# Patient Record
Sex: Male | Born: 1955
Health system: Southern US, Community
[De-identification: ages and names within clinical notes are randomized; demographics above are authoritative.]

## PROBLEM LIST (undated history)

## (undated) DIAGNOSIS — E119 Type 2 diabetes mellitus without complications: Secondary | ICD-10-CM

## (undated) DIAGNOSIS — I10 Essential (primary) hypertension: Secondary | ICD-10-CM

## (undated) DIAGNOSIS — E785 Hyperlipidemia, unspecified: Secondary | ICD-10-CM

## (undated) HISTORY — DX: Type 2 diabetes mellitus without complications: E11.9

## (undated) HISTORY — DX: Hyperlipidemia, unspecified: E78.5

## (undated) HISTORY — DX: Essential (primary) hypertension: I10

---

## 1997-11-02 ENCOUNTER — Emergency Department (HOSPITAL_COMMUNITY): Admission: EM | Admit: 1997-11-02 | Discharge: 1997-11-02 | Payer: Self-pay | Admitting: Emergency Medicine

## 2001-01-06 ENCOUNTER — Ambulatory Visit (HOSPITAL_COMMUNITY): Admission: RE | Admit: 2001-01-06 | Discharge: 2001-01-06 | Payer: Self-pay | Admitting: Gastroenterology

## 2002-11-18 ENCOUNTER — Emergency Department (HOSPITAL_COMMUNITY): Admission: AD | Admit: 2002-11-18 | Discharge: 2002-11-18 | Payer: Self-pay | Admitting: Family Medicine

## 2004-11-12 ENCOUNTER — Encounter: Admission: RE | Admit: 2004-11-12 | Discharge: 2004-11-12 | Payer: Self-pay | Admitting: Cardiology

## 2008-01-05 ENCOUNTER — Encounter: Admission: RE | Admit: 2008-01-05 | Discharge: 2008-01-05 | Payer: Self-pay | Admitting: Cardiology

## 2009-04-18 IMAGING — CR DG CHEST 2V
2 series · 2 of 2 positions shown · non-contrast
Comparison: 11/12/2004

CLINICAL DATA: Cough, sinusitis

CHEST - 2 VIEW

[w chest pa]
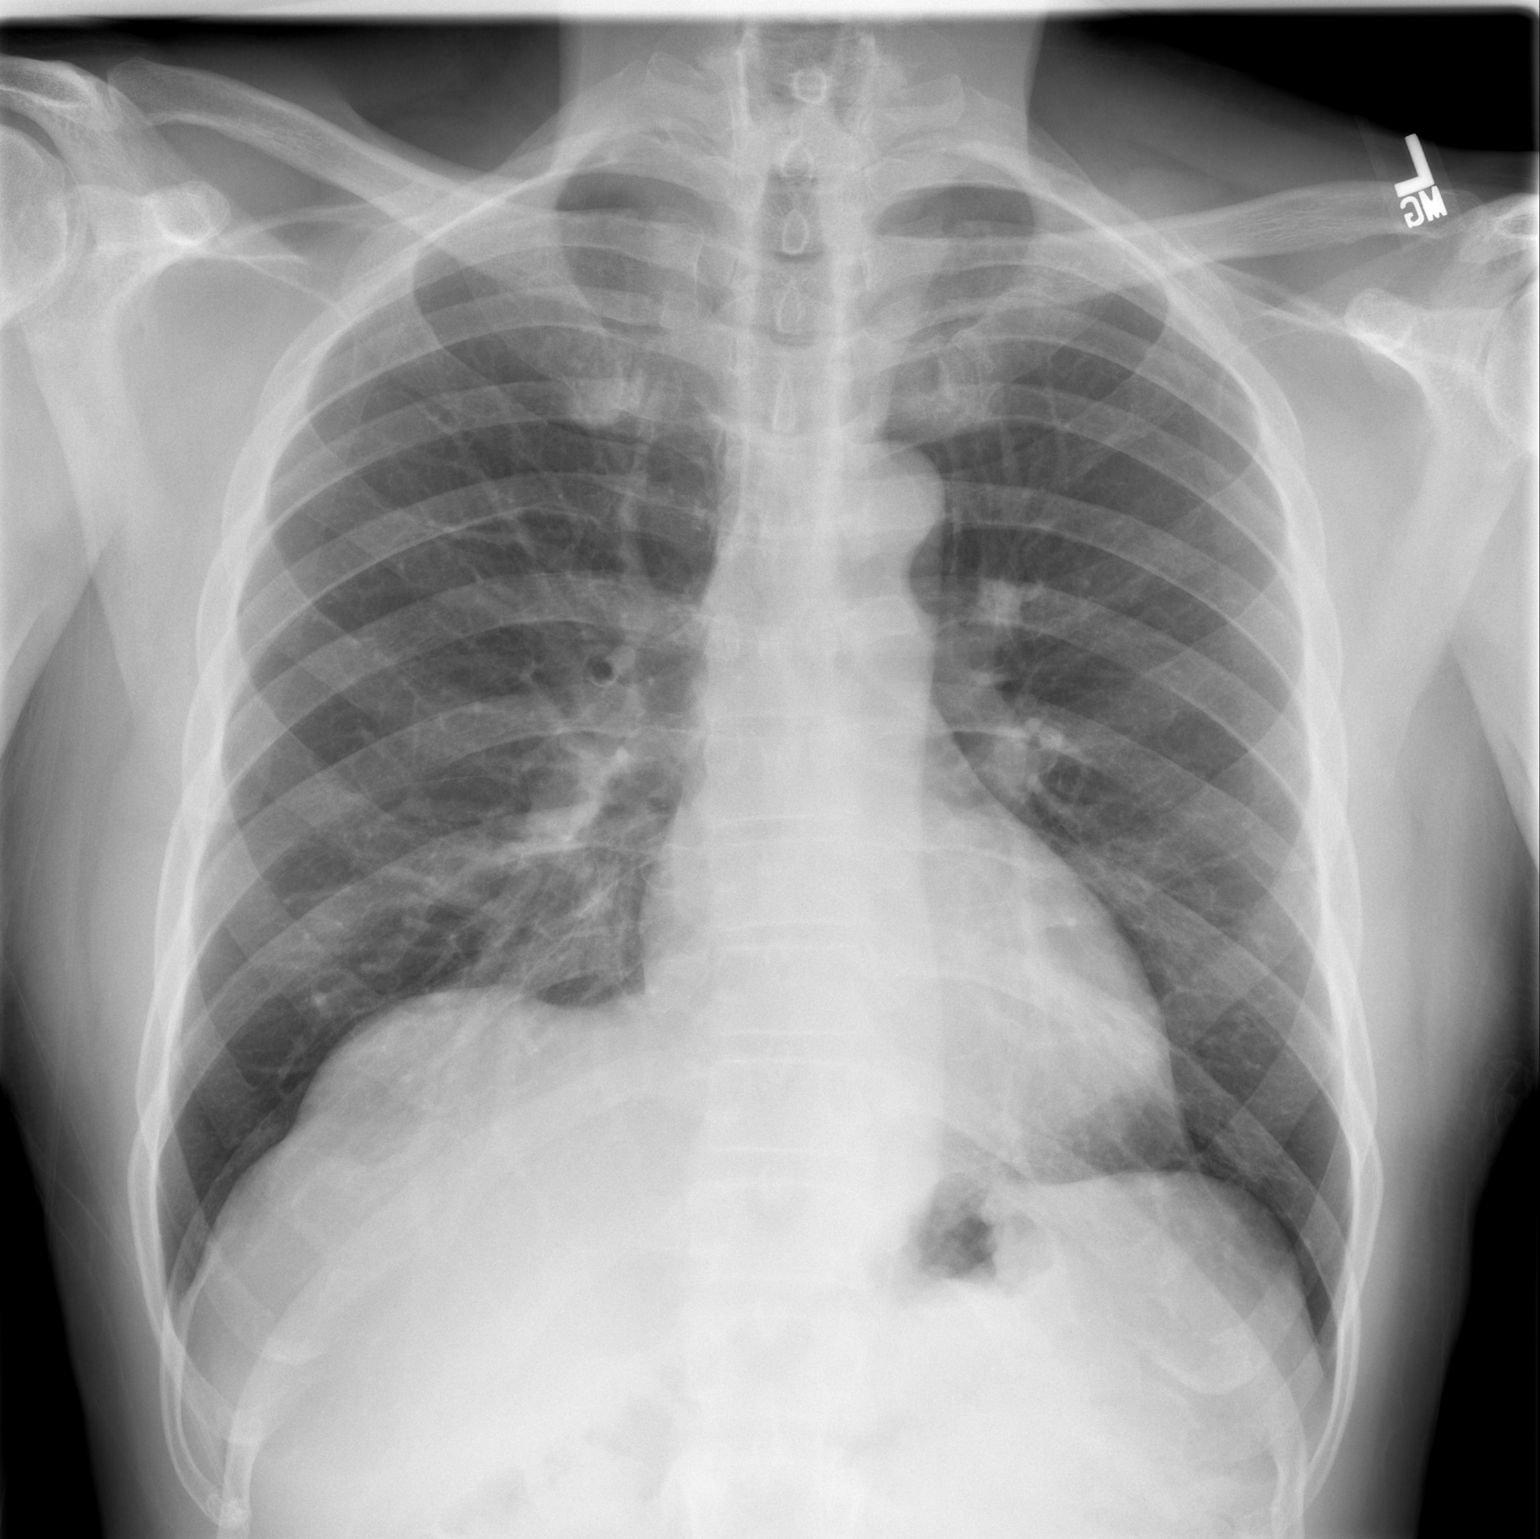

[w chest lat]
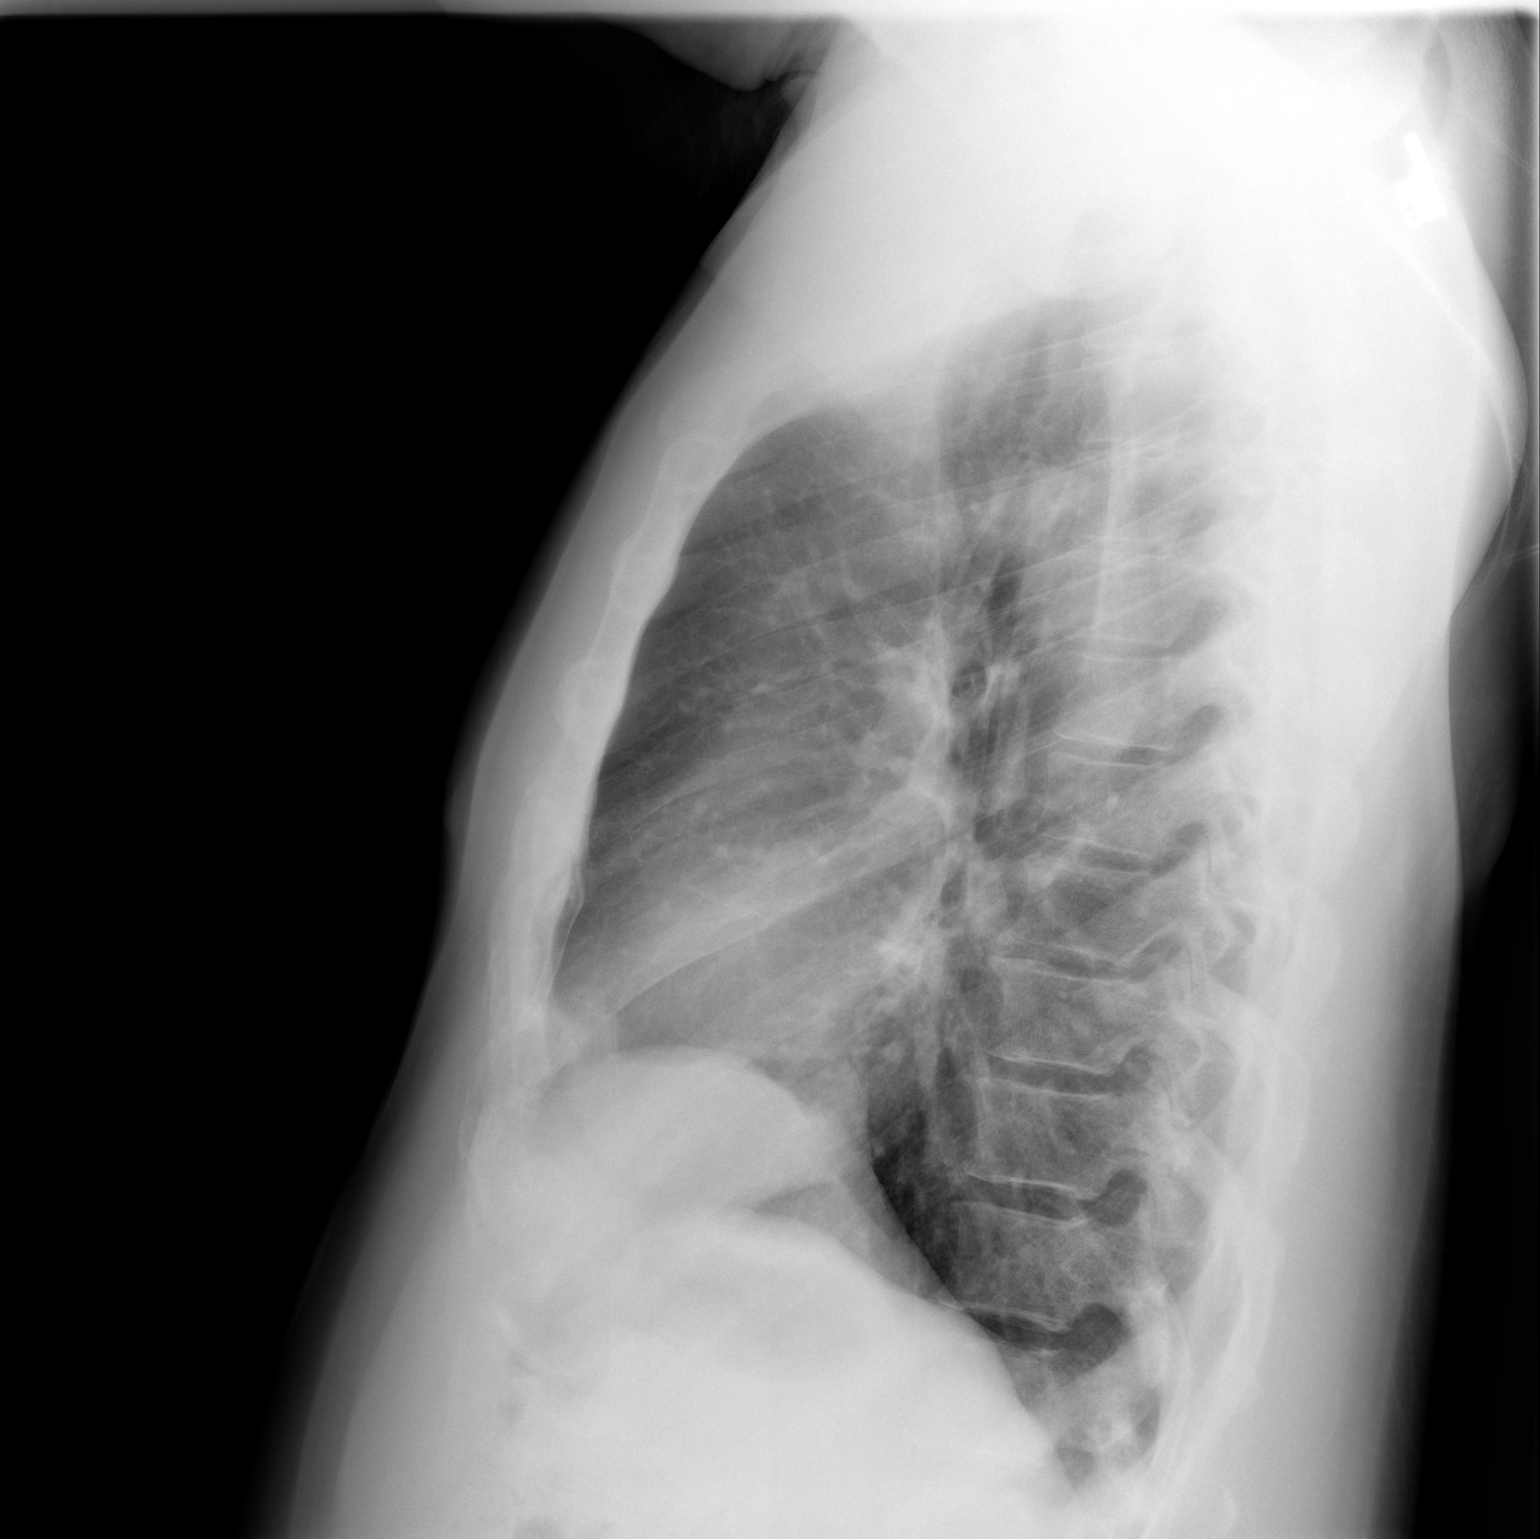

[2 of 2 positions shown; findings below may reference images not displayed]

FINDINGS: Lungs clear.  Heart size and pulmonary vascularity
normal.  No effusion.  Visualized bones unremarkable.
IMPRESSION: No acute disease

## 2010-06-14 NOTE — Procedures (Signed)
[facility]  Patient:    Fernando Ingram, Fernando Ingram Visit Number: [id number] MRN: [id number]          Service Type: Attending:  Verlin Grills, M.D. Dictated by:   Verlin Grills, M.D. Proc. Date: 01/06/01   CC:         Dr. Estill Cotta   Procedure Report  PROCEDURE:  Esophagogastroduodenoscopy.  REFERRING PHYSICIAN:  Dr. Lorin Picket _________.  INDICATIONS FOR PROCEDURE:  The patient (date of birth, 08-Dec-1955) is a 55 year old male with heartburn and dysphagia.  I discussed with the patient the complications associated with esophagogastroduodenoscopy and esophageal dilation including intestinal bleeding and intestinal perforation.  The patient has signed the operative permit.  ENDOSCOPIST:  Verlin Grills, M.D.  PREMEDICATION:  Versed 10 mg and Demerol 100 mg.  ENDOSCOPE:  Olympus gastroscope.  DESCRIPTION OF PROCEDURE:  After obtaining informed consent, the patient was placed in the left lateral decubitus position.  I administered intravenous Demerol and intravenous Versed to achieve conscious sedation for the procedure.  The patients blood pressure, oxygen saturation, and cardiac rhythm were monitored throughout the procedure and documented in the medical record.  The Olympus gastroscope was passed through the posterior hypopharynx and the proximal esophagus without difficulty.  The hypopharynx, larynx, and vocal cords appeared normal.  Esophagoscopy:  The proximal, mid, and lower segments of the esophagus appear normal.  The squamocolumnar junction and the esophagogastric junction noted at 40 cm from the incisor teeth.  Endoscopically, there is no evidence of the presence of Barretts esophagus, erosive esophagitis, esophageal mucosal scarring, esophageal stricture formation, esophageal tumors, or esophageal ulcers.  Gastroscopy:  I was easily able to traverse the lower esophageal sphincter and entered the stomach.   Retroflexed view of the gastric cardia and fundus was normal.  The gastric body, antrum, and pylorus appeared normal.  Duodenoscopy:  The duodenal bulb, mid duodenum, and distal duodenum appeared normal.  ASSESSMENT:  Normal esophagogastroduodenoscopy.  RECOMMENDATIONS:  The patient has nonerosive esophageal reflux disease.  His dysphagia is probably due to esophageal motility disorder and associated with his acid reflux.  I doubt he has achalasia.  I would treat his heartburn with a proton pump inhibitor and use p.r.n. sublingual hyoscyamine or episodes of dysphagia. Dictated by:   Verlin Grills, M.D. Attending:  Verlin Grills, M.D. DD:  01/06/01 TD:  01/06/01 Job: [id number]

## 2011-02-11 ENCOUNTER — Ambulatory Visit: Payer: Self-pay

## 2011-03-25 ENCOUNTER — Ambulatory Visit
Admission: RE | Admit: 2011-03-25 | Discharge: 2011-03-25 | Disposition: A | Discharge status: Home / Self Care | Payer: 59 | Source: Ambulatory Visit | Attending: Cardiology | Admitting: Cardiology

## 2011-03-25 ENCOUNTER — Other Ambulatory Visit: Payer: Self-pay | Admitting: Cardiology

## 2011-03-25 DIAGNOSIS — E785 Hyperlipidemia, unspecified: Secondary | ICD-10-CM

## 2011-03-25 DIAGNOSIS — G8929 Other chronic pain: Secondary | ICD-10-CM

## 2016-07-09 DIAGNOSIS — Z0000 Encounter for general adult medical examination without abnormal findings: Secondary | ICD-10-CM | POA: Diagnosis not present

## 2016-07-09 DIAGNOSIS — I10 Essential (primary) hypertension: Secondary | ICD-10-CM | POA: Diagnosis not present

## 2016-07-09 DIAGNOSIS — E785 Hyperlipidemia, unspecified: Secondary | ICD-10-CM | POA: Diagnosis not present

## 2016-07-09 DIAGNOSIS — E119 Type 2 diabetes mellitus without complications: Secondary | ICD-10-CM | POA: Diagnosis not present

## 2016-08-26 DIAGNOSIS — Z1211 Encounter for screening for malignant neoplasm of colon: Secondary | ICD-10-CM | POA: Diagnosis not present

## 2016-08-26 DIAGNOSIS — K640 First degree hemorrhoids: Secondary | ICD-10-CM | POA: Diagnosis not present

## 2016-08-26 DIAGNOSIS — Z800 Family history of malignant neoplasm of digestive organs: Secondary | ICD-10-CM | POA: Diagnosis not present

## 2017-01-08 DIAGNOSIS — E785 Hyperlipidemia, unspecified: Secondary | ICD-10-CM | POA: Diagnosis not present

## 2017-01-08 DIAGNOSIS — I10 Essential (primary) hypertension: Secondary | ICD-10-CM | POA: Diagnosis not present

## 2017-01-08 DIAGNOSIS — E119 Type 2 diabetes mellitus without complications: Secondary | ICD-10-CM | POA: Diagnosis not present

## 2017-07-27 DIAGNOSIS — Z23 Encounter for immunization: Secondary | ICD-10-CM | POA: Diagnosis not present

## 2017-07-27 DIAGNOSIS — I10 Essential (primary) hypertension: Secondary | ICD-10-CM | POA: Diagnosis not present

## 2017-07-27 DIAGNOSIS — E119 Type 2 diabetes mellitus without complications: Secondary | ICD-10-CM | POA: Diagnosis not present

## 2017-07-27 DIAGNOSIS — E785 Hyperlipidemia, unspecified: Secondary | ICD-10-CM | POA: Diagnosis not present

## 2017-07-27 DIAGNOSIS — Z0000 Encounter for general adult medical examination without abnormal findings: Secondary | ICD-10-CM | POA: Diagnosis not present

## 2017-09-14 DIAGNOSIS — H25812 Combined forms of age-related cataract, left eye: Secondary | ICD-10-CM | POA: Diagnosis not present

## 2017-09-14 DIAGNOSIS — H1045 Other chronic allergic conjunctivitis: Secondary | ICD-10-CM | POA: Diagnosis not present

## 2017-09-14 DIAGNOSIS — E119 Type 2 diabetes mellitus without complications: Secondary | ICD-10-CM | POA: Diagnosis not present

## 2018-02-03 DIAGNOSIS — E785 Hyperlipidemia, unspecified: Secondary | ICD-10-CM | POA: Diagnosis not present

## 2018-02-03 DIAGNOSIS — I10 Essential (primary) hypertension: Secondary | ICD-10-CM | POA: Diagnosis not present

## 2018-02-03 DIAGNOSIS — E119 Type 2 diabetes mellitus without complications: Secondary | ICD-10-CM | POA: Diagnosis not present

## 2018-03-15 ENCOUNTER — Ambulatory Visit: Payer: 59 | Admitting: Podiatry

## 2018-03-15 ENCOUNTER — Encounter: Payer: Self-pay | Admitting: Podiatry

## 2018-03-15 DIAGNOSIS — Z79899 Other long term (current) drug therapy: Secondary | ICD-10-CM

## 2018-03-15 DIAGNOSIS — B351 Tinea unguium: Secondary | ICD-10-CM | POA: Diagnosis not present

## 2018-03-15 NOTE — Progress Notes (Signed)
   Subjective:    Patient ID: Fernando Ingram, male    DOB: 1955-06-21, 63 y.o.   MRN: [id number]  HPI 63 year old male presents the office today for concerns of toenail fungus.  He states the nails occasionally become uncomfortable with pressure in shoes.  He states that some of his toenails have come off on their own.  Denies any redness or drainage or any swelling to the toenail sites.  He is diabetic and his A1c has been around 9.   Review of Systems  All other systems reviewed and are negative.  History reviewed. No pertinent past medical history.  History reviewed. No pertinent surgical history.   Current Outpatient Medications:  .  atorvastatin (LIPITOR) 20 MG tablet, Take 20 mg by mouth daily., Disp: , Rfl:  .  losartan (COZAAR) 100 MG tablet, Take 100 mg by mouth daily., Disp: , Rfl:  .  metFORMIN (GLUCOPHAGE-XR) 750 MG 24 hr tablet, Take 750 mg by mouth daily with breakfast., Disp: , Rfl:   No Known Allergies      Objective:   Physical Exam  General: AAO x3, NAD  Dermatological: Nails are hypertrophic, dystrophic with yellow to brown discoloration.  There is no pain to the nails there is no surrounding redness or drainage or any signs of infection.  No open lesions.  Vascular: Dorsalis Pedis artery and Posterior Tibial artery pedal pulses are 2/4 bilateral with immedate capillary fill time.  There is no pain with calf compression, swelling, warmth, erythema.   Neruologic: Grossly intact via light touch bilateral.  Protective threshold with Semmes Wienstein monofilament intact to all pedal sites bilateral.  Musculoskeletal: No gross boney pedal deformities bilateral. No pain, crepitus, or limitation noted with foot and ankle range of motion bilateral. Muscular strength 5/5 in all groups tested bilateral.  Gait: Unassisted, Nonantalgic.     Assessment & Plan:  63 year old male with onychomycosis -Treatment options discussed including all alternatives, risks, and  complications -Etiology of symptoms were discussed -After discussed treatment options he elects to proceed with oral therapy.  Discussed Lamisil including side effects, success rates.  I want to check a CBC and LFT.  Once this comes back normal then I will order the medication for 90 days I will see him back 4 to 6 weeks after initiation of treatment.  He is to call let us know if he is having any issues taking the medication he verbally understood.  Vivi Barrack DPM

## 2018-03-15 NOTE — Patient Instructions (Signed)

## 2018-03-16 LAB — HEPATIC FUNCTION PANEL
AG Ratio: 1.7 (calc) (ref 1.0–2.5)
ALT: 11 U/L (ref 9–46)
AST: 17 U/L (ref 10–35)
Albumin: 4 g/dL (ref 3.6–5.1)
Alkaline phosphatase (APISO): 51 U/L (ref 35–144)
Bilirubin, Direct: 0.1 mg/dL (ref 0.0–0.2)
Globulin: 2.4 g/dL (calc) (ref 1.9–3.7)
Indirect Bilirubin: 0.4 mg/dL (calc) (ref 0.2–1.2)
Total Bilirubin: 0.5 mg/dL (ref 0.2–1.2)
Total Protein: 6.4 g/dL (ref 6.1–8.1)

## 2018-03-16 LAB — CBC WITH DIFFERENTIAL/PLATELET
Absolute Monocytes: 437 cells/uL (ref 200–950)
Basophils Absolute: 28 cells/uL (ref 0–200)
Basophils Relative: 0.5 %
Eosinophils Absolute: 202 cells/uL (ref 15–500)
Eosinophils Relative: 3.6 %
HCT: 33 % — ABNORMAL LOW (ref 38.5–50.0)
Hemoglobin: 10.7 g/dL — ABNORMAL LOW (ref 13.2–17.1)
Lymphs Abs: 2229 cells/uL (ref 850–3900)
MCH: 31.2 pg (ref 27.0–33.0)
MCHC: 32.4 g/dL (ref 32.0–36.0)
MCV: 96.2 fL (ref 80.0–100.0)
MPV: 12 fL (ref 7.5–12.5)
Monocytes Relative: 7.8 %
Neutro Abs: 2705 cells/uL (ref 1500–7800)
Neutrophils Relative %: 48.3 %
Platelets: 240 10*3/uL (ref 140–400)
RBC: 3.43 10*6/uL — ABNORMAL LOW (ref 4.20–5.80)
RDW: 11 % (ref 11.0–15.0)
Total Lymphocyte: 39.8 %
WBC: 5.6 10*3/uL (ref 3.8–10.8)

## 2018-03-17 ENCOUNTER — Telehealth: Payer: Self-pay | Admitting: *Deleted

## 2018-03-17 NOTE — Telephone Encounter (Signed)
Left message for pt to call for results  

## 2018-03-17 NOTE — Telephone Encounter (Signed)
-----   Message from Matthew R Wagoner, DPM sent at 03/17/2018  1:32 PM EST ----- Val- please let him know that he can start Lamisil. Please order 90 day supply. Also please send to his PCP as his hemoglobin is low. Thanks. 

## 2018-03-23 ENCOUNTER — Telehealth: Payer: Self-pay | Admitting: *Deleted

## 2018-03-23 DIAGNOSIS — B351 Tinea unguium: Secondary | ICD-10-CM | POA: Insufficient documentation

## 2018-03-23 NOTE — Telephone Encounter (Signed)
Left message on home phone for pt to call for results.  

## 2018-03-23 NOTE — Telephone Encounter (Signed)
-----   Message from Vivi Barrack, DPM sent at 03/17/2018  1:32 PM EST ----- Val- please let him know that he can start Lamisil. Please order 90 day supply. Also please send to his PCP as his hemoglobin is low. Thanks.

## 2018-03-23 NOTE — Telephone Encounter (Signed)
Left message on pt's mobile phone for pt to call for results. 

## 2018-03-25 NOTE — Telephone Encounter (Signed)
Mailed letter informing pt of Dr. Gabriel Rung orders and copy of labs to take to the PCP due to low hemoglogin.

## 2018-03-26 ENCOUNTER — Encounter: Payer: Self-pay | Admitting: *Deleted

## 2018-03-26 MED ORDER — TERBINAFINE HCL 250 MG PO TABS: 250.0000 mg | ORAL_TABLET | Freq: Every day | ORAL | 0 refills | Status: DC

## 2018-03-26 NOTE — Addendum Note (Signed)
Addended by: Alphia Kava D on: 03/26/2018 09:09 AM   Modules accepted: Orders

## 2018-04-26 ENCOUNTER — Ambulatory Visit: Payer: 59 | Admitting: Podiatry

## 2018-09-27 ENCOUNTER — Telehealth: Payer: Self-pay | Admitting: Medical Oncology

## 2018-09-27 NOTE — Telephone Encounter (Signed)
err

## 2020-05-30 DIAGNOSIS — H25812 Combined forms of age-related cataract, left eye: Secondary | ICD-10-CM | POA: Diagnosis not present

## 2020-05-30 DIAGNOSIS — E119 Type 2 diabetes mellitus without complications: Secondary | ICD-10-CM | POA: Diagnosis not present

## 2020-06-22 DIAGNOSIS — Z7984 Long term (current) use of oral hypoglycemic drugs: Secondary | ICD-10-CM | POA: Diagnosis not present

## 2020-06-22 DIAGNOSIS — R809 Proteinuria, unspecified: Secondary | ICD-10-CM | POA: Diagnosis not present

## 2020-06-22 DIAGNOSIS — I10 Essential (primary) hypertension: Secondary | ICD-10-CM | POA: Diagnosis not present

## 2020-06-22 DIAGNOSIS — E119 Type 2 diabetes mellitus without complications: Secondary | ICD-10-CM | POA: Diagnosis not present

## 2020-06-22 DIAGNOSIS — M25511 Pain in right shoulder: Secondary | ICD-10-CM | POA: Diagnosis not present

## 2020-06-22 DIAGNOSIS — E785 Hyperlipidemia, unspecified: Secondary | ICD-10-CM | POA: Diagnosis not present

## 2020-06-22 DIAGNOSIS — Z0000 Encounter for general adult medical examination without abnormal findings: Secondary | ICD-10-CM | POA: Diagnosis not present

## 2020-08-22 DIAGNOSIS — Z23 Encounter for immunization: Secondary | ICD-10-CM | POA: Diagnosis not present

## 2020-12-24 DIAGNOSIS — Z7984 Long term (current) use of oral hypoglycemic drugs: Secondary | ICD-10-CM | POA: Diagnosis not present

## 2020-12-24 DIAGNOSIS — E785 Hyperlipidemia, unspecified: Secondary | ICD-10-CM | POA: Diagnosis not present

## 2020-12-24 DIAGNOSIS — E119 Type 2 diabetes mellitus without complications: Secondary | ICD-10-CM | POA: Diagnosis not present

## 2020-12-24 DIAGNOSIS — D6489 Other specified anemias: Secondary | ICD-10-CM | POA: Diagnosis not present

## 2020-12-24 DIAGNOSIS — I10 Essential (primary) hypertension: Secondary | ICD-10-CM | POA: Diagnosis not present

## 2021-03-26 DIAGNOSIS — I10 Essential (primary) hypertension: Secondary | ICD-10-CM | POA: Diagnosis not present

## 2021-03-26 DIAGNOSIS — E785 Hyperlipidemia, unspecified: Secondary | ICD-10-CM | POA: Diagnosis not present

## 2021-03-26 DIAGNOSIS — E1165 Type 2 diabetes mellitus with hyperglycemia: Secondary | ICD-10-CM | POA: Diagnosis not present

## 2021-03-26 DIAGNOSIS — E118 Type 2 diabetes mellitus with unspecified complications: Secondary | ICD-10-CM | POA: Diagnosis not present

## 2021-05-20 ENCOUNTER — Encounter: Payer: Self-pay | Admitting: Internal Medicine

## 2021-05-20 ENCOUNTER — Ambulatory Visit (INDEPENDENT_AMBULATORY_CARE_PROVIDER_SITE_OTHER): Payer: Medicare Other | Admitting: Internal Medicine

## 2021-05-20 VITALS — BP 128/74 | HR 73 | Temp 98.0°F | Ht 77.0 in | Wt 212.9 lb

## 2021-05-20 DIAGNOSIS — E785 Hyperlipidemia, unspecified: Secondary | ICD-10-CM

## 2021-05-20 DIAGNOSIS — D649 Anemia, unspecified: Secondary | ICD-10-CM

## 2021-05-20 DIAGNOSIS — M25511 Pain in right shoulder: Secondary | ICD-10-CM | POA: Diagnosis not present

## 2021-05-20 DIAGNOSIS — B351 Tinea unguium: Secondary | ICD-10-CM

## 2021-05-20 DIAGNOSIS — E119 Type 2 diabetes mellitus without complications: Secondary | ICD-10-CM | POA: Diagnosis not present

## 2021-05-20 DIAGNOSIS — Z0000 Encounter for general adult medical examination without abnormal findings: Secondary | ICD-10-CM

## 2021-05-20 DIAGNOSIS — G8929 Other chronic pain: Secondary | ICD-10-CM

## 2021-05-20 DIAGNOSIS — I10 Essential (primary) hypertension: Secondary | ICD-10-CM

## 2021-05-20 DIAGNOSIS — Z800 Family history of malignant neoplasm of digestive organs: Secondary | ICD-10-CM

## 2021-05-20 LAB — POCT GLYCOSYLATED HEMOGLOBIN (HGB A1C): Hemoglobin A1C: 5.8 % — AB (ref 4.0–5.6)

## 2021-05-20 LAB — GLUCOSE, CAPILLARY: Glucose-Capillary: 89 mg/dL (ref 70–99)

## 2021-05-20 NOTE — Assessment & Plan Note (Signed)
Has onychomycosis of bilateral great toes. Has previously tried terbinafine for this without significant improvement. He does have one well-healing lesion on the dorsal aspect of his right foot without evidence of infection.  ? ?Plan: ?Referral to podiatry for diabetic foot care and onychomycosis ?

## 2021-05-20 NOTE — Assessment & Plan Note (Signed)
Patient has positive family history of colon cancer with his father being diagnosed in his 19s. Patient had a colonoscopy in 2018 with Dr [facility]. Patient reports that at the time, he had some polyps removed. He denies any fatigue, weight loss, night sweats, melanotic stools or change in caliber of stools. I suspect that he is due for colonoscopy ? ?Plan: ?Obtain records from [facility]  ?Colonoscopy for colon cancer screening as indicated  ?

## 2021-05-20 NOTE — Assessment & Plan Note (Addendum)
BP Readings from Last 3 Encounters:  ?05/20/21 128/74  ? ?Patient is currently on losartan 100mg  daily for hypertension. He is tolerating this well and BP is currently at goal on this regimen. He denies any headaches, vision changes, lightheadedness/dizziness, chest pain or palpitations or focal weakness. ? ?Plan: ?Continue losartan 100mg  daily ?CMP today ?Follow up in 3 months  ? ?ADDENDUM: ?sCr slightly elevated to 1.29; however, no known baseline.  ?

## 2021-05-20 NOTE — Assessment & Plan Note (Signed)
Patient reports chronic right shoulder pain for which he was suspected to have a rotator cuff injury. He reports taking tylenol 1000mg  nightly for this to help with the pain. He reports that his shoulder often becomes stiff. On examination, there is no obvious deformity or tenderness to palpation of the right shoulder. Has full ROM but pain reproducible with internal rotation of the shoulder.  ?Ddx includes adhesive capsulitis vs rotator cuff injury  ? ?Plan: ?ROM exercises  ?If no improvement, will consider shoulder injection vs referral to sports medicine ?

## 2021-05-20 NOTE — Progress Notes (Signed)
? ?  CC: establish care ? ?HPI: ? ?[name] is a 66 y.o. male with PMHx as stated below presenting to establish care and follow up for diabetes. He does report some right shoulder pain at night for which he takes tylenol as needed. Please see problem based charting for complete assessment and plan. ? ?PMHx: ?Diabetes  ?Hypertension ?Hyperlipidemia ? ?Surgical Hx: ?No past surgical history on file. ? ?Family Hx: ?Mother - heart disease ?Father - colon cancer, diagnosed in 16s ? ?Social Hx:  ?Lives at home with wife. 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  ?Independent in ADLs and iADLs.  ?Denies tobacco use history ?Alcohol use - twice a year ?OTC meds - multivitamins, vitamin C, vitamin D3 and vitamin B12 ? ?Review of Systems:  Negative except as stated in HPI.  ? ?Physical Exam: ? ?Vitals:  ? 05/20/21 0833  ?BP: 128/74  ?Pulse: 73  ?Temp: (!) 82 ?F (27.8 ?C)  ?TempSrc: Oral  ?SpO2: 100%  ?Weight: 212 lb 14.4 oz (96.6 kg)  ?Height: 6\' 5"  (1.956 m)  ? ?Physical Exam  ?Constitutional: Appears well-developed and well-nourished. No distress.  ?HENT: Normocephalic and atraumatic, EOMI, conjunctiva normal, moist mucous membranes ?Cardiovascular: Normal rate, regular rhythm, S1 and S2 present, no murmurs, rubs, gallops.  Distal pulses intact ?Respiratory:  Effort is normal on room air.  Lungs are clear to auscultation bilaterally. ?GI: Nondistended, soft, nontender to palpation, normal active bowel sounds ?Musculoskeletal: Normal bulk and tone.  No peripheral edema noted. ?R shoulder: No obvious deformity or swelling noted. Palpation normal with no TTP over [facility] joint or bicipital groove. Full ROM in flexion, abduction, internal and external rotation but does have reproducible pain on internal rotation. Neg empty can test. Neurovascularly and strength intact.  ?Neurological: Is alert and oriented x4, no apparent focal deficits noted. ?Skin: Warm and dry.  Old healing lesion on the dorsal aspect of R foot  without evidence of infection. onychomycosis of great toes of bilateral feet. No rash, erythema, lesions noted. ?Psychiatric: Normal mood and affect. Behavior is normal.  ? ?Assessment & Plan:  ? ?See Encounters Tab for problem based charting. ? ?Patient discussed with Dr. Dareen Piano ? ?

## 2021-05-20 NOTE — Assessment & Plan Note (Signed)
HCV testing today ?Patient defers pneumococcal vaccine at this time  ?

## 2021-05-20 NOTE — Patient Instructions (Addendum)
Mr Fernando Ingram, ? ?It was a pleasure seeing you in clinic. Today we discussed:  ? ?Diabetes: ?Your A1c is 5.8 today. Great job! You may decrease your metformin to once daily.  ?I am also placing a referral to podiatry for foot care. ? ?Blood pressure and cholesterol: ?I am checking some blood work today. I will call you with the results. Please continue to take all medications as prescribed.  ? ?R shoulder pain: ?Range of motion exercises as provided  ?Please limit tylenol intake  ? ?If you have any questions or concerns, please call our clinic at [phone] between 9am-5pm and after hours call [phone] and ask for the internal medicine resident on call. If you feel you are having a medical emergency please call 911.  ? ?Thank you, we look forward to helping you remain healthy! ? ? ? ?

## 2021-05-20 NOTE — Assessment & Plan Note (Addendum)
Patient is currently on atorvastatin 20mg  daily and tolerating well. ? ?Plan: ?Continue atorvastatin  ?Lipid panel today ? ?ADDENDUM: ? ?Lipid Panel  ?   ?Component Value Date/Time  ? CHOL 122 05/20/2021 0922  ? TRIG 57 05/20/2021 0922  ? HDL 42 05/20/2021 0922  ? CHOLHDL 2.9 05/20/2021 0922  ? LDLCALC 67 05/20/2021 0922  ? LABVLDL 13 05/20/2021 0922  ? ?LDL currently at goal <100 on current statin dosing. ? ?Plan: ?Continue atorvastatin 20mg  daily ?

## 2021-05-20 NOTE — Assessment & Plan Note (Signed)
Patient has at least a 10-year history of type 2 diabetes mellitus for which he has been on metformin 750mg  twice daily. He reports compliance with medication regimen and is tolerating this well. His A1c is 5.8 at this visit. Patient reports having diabetic eye exam yesterday. He reports recent dietary changes including cutting out carbohydrates. He denies any episodes of hypoglycemia. ? ?Plan: ?Advised to decrease metformin to once daily dosing ?Foot exam performed at this visit ?Follow up in 3 months for repeat A1c  ?

## 2021-05-21 LAB — CMP14 + ANION GAP
ALT: 12 IU/L (ref 0–44)
AST: 13 IU/L (ref 0–40)
Albumin/Globulin Ratio: 1.8 (ref 1.2–2.2)
Albumin: 4.3 g/dL (ref 3.8–4.8)
Alkaline Phosphatase: 61 IU/L (ref 44–121)
Anion Gap: 12 mmol/L (ref 10.0–18.0)
BUN/Creatinine Ratio: 22 (ref 10–24)
BUN: 29 mg/dL — ABNORMAL HIGH (ref 8–27)
Bilirubin Total: 0.7 mg/dL (ref 0.0–1.2)
CO2: 23 mmol/L (ref 20–29)
Calcium: 9.5 mg/dL (ref 8.6–10.2)
Chloride: 103 mmol/L (ref 96–106)
Creatinine, Ser: 1.29 mg/dL — ABNORMAL HIGH (ref 0.76–1.27)
Globulin, Total: 2.4 g/dL (ref 1.5–4.5)
Glucose: 85 mg/dL (ref 70–99)
Potassium: 3.8 mmol/L (ref 3.5–5.2)
Sodium: 138 mmol/L (ref 134–144)
Total Protein: 6.7 g/dL (ref 6.0–8.5)
eGFR: 61 mL/min/{1.73_m2} (ref >59)

## 2021-05-21 LAB — CBC
Hematocrit: 37.4 % — ABNORMAL LOW (ref 37.5–51.0)
Hemoglobin: 12 g/dL — ABNORMAL LOW (ref 13.0–17.7)
MCH: 31.1 pg (ref 26.6–33.0)
MCHC: 32.1 g/dL (ref 31.5–35.7)
MCV: 97 fL (ref 79–97)
Platelets: 262 10*3/uL (ref 150–450)
RBC: 3.86 x10E6/uL — ABNORMAL LOW (ref 4.14–5.80)
RDW: 11.3 % — ABNORMAL LOW (ref 11.6–15.4)
WBC: 5.7 10*3/uL (ref 3.4–10.8)

## 2021-05-21 LAB — LIPID PANEL
Chol/HDL Ratio: 2.9 ratio (ref 0.0–5.0)
Cholesterol, Total: 122 mg/dL (ref 100–199)
HDL: 42 mg/dL (ref >39)
LDL Chol Calc (NIH): 67 mg/dL (ref 0–99)
Triglycerides: 57 mg/dL (ref 0–149)
VLDL Cholesterol Cal: 13 mg/dL (ref 5–40)

## 2021-05-21 LAB — HEPATITIS C ANTIBODY: Hep C Virus Ab: NONREACTIVE

## 2021-05-22 DIAGNOSIS — D649 Anemia, unspecified: Secondary | ICD-10-CM | POA: Insufficient documentation

## 2021-05-22 NOTE — Assessment & Plan Note (Signed)
CBC with normocytic anemia, slightly improved from prior. Will need to obtain iron studies at next visit. Given family history of colon cancer, and personal history of polyps, may need close monitoring for colon cancer detection ? ?Plan: ?Iron studies at next visit.  ?

## 2021-05-22 NOTE — Progress Notes (Signed)
Internal Medicine Clinic Attending ° °Case discussed with Dr. Aslam  At the time of the visit.  We reviewed the resident’s history and exam and pertinent patient test results.  I agree with the assessment, diagnosis, and plan of care documented in the resident’s note.  °

## 2021-05-23 ENCOUNTER — Encounter: Payer: Self-pay | Admitting: Internal Medicine

## 2021-06-26 ENCOUNTER — Ambulatory Visit: Payer: Medicare Other | Admitting: Podiatry

## 2021-07-10 ENCOUNTER — Other Ambulatory Visit: Payer: Self-pay | Admitting: Internal Medicine

## 2021-07-10 NOTE — Progress Notes (Signed)
Colonoscopy from 08/26/16 reviewed. They recommended repeat colonoscopy in 5 years (08/26/21) for screening purposes. Will discuss with patient at his next visit and I updated HCM tab

## 2021-11-24 NOTE — Progress Notes (Deleted)
HTN Losartan 100mg  daily  T2DM A1c 5.8 04/2021 Metformin 750 daily A1c  HLD LDL 67 at gol <100 04/2021 Atorvastatin 20mg  daily  Normocytic anemia Iron studes Cbc  Family history colon cancer, father 79s Last done in 2018 normal, repeat 5 years -refer colonoscopy***  HCM Flu Optho zoster

## 2021-11-26 ENCOUNTER — Encounter: Payer: Self-pay | Admitting: Student

## 2021-11-26 ENCOUNTER — Ambulatory Visit (INDEPENDENT_AMBULATORY_CARE_PROVIDER_SITE_OTHER): Payer: Medicare Other | Admitting: Student

## 2021-11-26 ENCOUNTER — Ambulatory Visit (INDEPENDENT_AMBULATORY_CARE_PROVIDER_SITE_OTHER): Payer: Medicare Other | Admitting: *Deleted

## 2021-11-26 VITALS — BP 136/81 | HR 78 | Temp 97.7°F | Ht 74.5 in | Wt 214.8 lb

## 2021-11-26 DIAGNOSIS — E119 Type 2 diabetes mellitus without complications: Secondary | ICD-10-CM

## 2021-11-26 DIAGNOSIS — Z0000 Encounter for general adult medical examination without abnormal findings: Secondary | ICD-10-CM

## 2021-11-26 DIAGNOSIS — I10 Essential (primary) hypertension: Secondary | ICD-10-CM | POA: Diagnosis not present

## 2021-11-26 DIAGNOSIS — Z7984 Long term (current) use of oral hypoglycemic drugs: Secondary | ICD-10-CM

## 2021-11-26 DIAGNOSIS — M7541 Impingement syndrome of right shoulder: Secondary | ICD-10-CM | POA: Diagnosis not present

## 2021-11-26 DIAGNOSIS — Z794 Long term (current) use of insulin: Secondary | ICD-10-CM | POA: Diagnosis not present

## 2021-11-26 LAB — POCT GLYCOSYLATED HEMOGLOBIN (HGB A1C): Hemoglobin A1C: 11 % — AB (ref 4.0–5.6)

## 2021-11-26 LAB — GLUCOSE, CAPILLARY: Glucose-Capillary: 167 mg/dL — ABNORMAL HIGH (ref 70–99)

## 2021-11-26 MED ORDER — TRULICITY 0.75 MG/0.5ML ~~LOC~~ SOAJ: 0.7500 mg | SUBCUTANEOUS | 2 refills | Status: DC

## 2021-11-26 MED ORDER — LOSARTAN POTASSIUM-HCTZ 100-12.5 MG PO TABS: 1.0000 | ORAL_TABLET | Freq: Every day | ORAL | 0 refills | Status: DC

## 2021-11-26 MED ORDER — DICLOFENAC SODIUM 1 % EX GEL: 2.0000 g | Freq: Four times a day (QID) | CUTANEOUS | 2 refills | Status: DC

## 2021-11-26 MED ORDER — METFORMIN HCL ER 750 MG PO TB24: 750.0000 mg | ORAL_TABLET | Freq: Every day | ORAL | 0 refills | Status: DC

## 2021-11-26 MED ORDER — DAPAGLIFLOZIN PROPANEDIOL 10 MG PO TABS: 10.0000 mg | ORAL_TABLET | Freq: Every day | ORAL | 0 refills | Status: DC

## 2021-11-26 MED ORDER — ATORVASTATIN CALCIUM 20 MG PO TABS: 20.0000 mg | ORAL_TABLET | Freq: Every day | ORAL | 0 refills | Status: DC

## 2021-11-26 NOTE — Patient Instructions (Signed)
[place], it was a pleasure seeing you today!  Today we discussed: - Diabetes: Your A1c is elevated to 11.0% today. I would like for you to continue metformin and [place]. In addition, I am going to add an injectable medication called Trulicity. You will take this once weekly. I have also put in a referral for you to visit with our diabetes educator and nutritionist.  - Blood pressure: I have refilled losartan-hydrochlorothiazide.   - Shoulder pain: I have given you voltaren gel for this. You can also take Tylenol 1000mg  three times daily. I would avoid taking Advil or ibuprofen.   I have ordered the following labs today:   Lab Orders         Glucose, capillary         POC Hbg A1C       Follow-up: 3 months   Please make sure to arrive 15 minutes prior to your next appointment. If you arrive late, you may be asked to reschedule.   We look forward to seeing you next time. Please call our clinic at [phone] if you have any questions or concerns. The best time to call is Monday-Friday from 9am-4pm, but there is someone available 24/7. If after hours or the weekend, call the main hospital number and ask for the Internal Medicine Resident On-Call. If you need medication refills, please notify your pharmacy one week in advance and they will send [place] a request.  Thank you for letting us take part in your care. Wishing you the best!  Thank you, Sanjuan Dame, MD

## 2021-11-26 NOTE — Patient Instructions (Signed)
Health Maintenance, Male Adopting a healthy lifestyle and getting preventive care are important in promoting health and wellness. Ask your health care provider about: The right schedule for you to have regular tests and exams. Things you can do on your own to prevent diseases and keep yourself healthy. What should I know about diet, weight, and exercise? Eat a healthy diet  Eat a diet that includes plenty of vegetables, fruits, low-fat dairy products, and lean protein. Do not eat a lot of foods that are high in solid fats, added sugars, or sodium. Maintain a healthy weight Body mass index (BMI) is a measurement that can be used to identify possible weight problems. It estimates body fat based on height and weight. Your health care provider can help determine your BMI and help you achieve or maintain a healthy weight. Get regular exercise Get regular exercise. This is one of the most important things you can do for your health. Most adults should: Exercise for at least 150 minutes each week. The exercise should increase your heart rate and make you sweat (moderate-intensity exercise). Do strengthening exercises at least twice a week. This is in addition to the moderate-intensity exercise. Spend less time sitting. Even light physical activity can be beneficial. Watch cholesterol and blood lipids Have your blood tested for lipids and cholesterol at 68 years of age, then have this test every 5 years. You may need to have your cholesterol levels checked more often if: Your lipid or cholesterol levels are high. You are older than 68 years of age. You are at high risk for heart disease. What should I know about cancer screening? Many types of cancers can be detected early and may often be prevented. Depending on your health history and family history, you may need to have cancer screening at various ages. This may include screening for: Colorectal cancer. Prostate cancer. Skin cancer. Lung  cancer. What should I know about heart disease, diabetes, and high blood pressure? Blood pressure and heart disease High blood pressure causes heart disease and increases the risk of stroke. This is more likely to develop in people who have high blood pressure readings or are overweight. Talk with your health care provider about your target blood pressure readings. Have your blood pressure checked: Every 3-5 years if you are 18-39 years of age. Every year if you are 40 years old or older. If you are between the ages of 65 and 75 and are a current or former smoker, ask your health care provider if you should have a one-time screening for abdominal aortic aneurysm (AAA). Diabetes Have regular diabetes screenings. This checks your fasting blood sugar level. Have the screening done: Once every three years after age 45 if you are at a normal weight and have a low risk for diabetes. More often and at a younger age if you are overweight or have a high risk for diabetes. What should I know about preventing infection? Hepatitis B If you have a higher risk for hepatitis B, you should be screened for this virus. Talk with your health care provider to find out if you are at risk for hepatitis B infection. Hepatitis C Blood testing is recommended for: Everyone born from 1945 through 1965. Anyone with known risk factors for hepatitis C. Sexually transmitted infections (STIs) You should be screened each year for STIs, including gonorrhea and chlamydia, if: You are sexually active and are younger than 68 years of age. You are older than 68 years of age and your   health care provider tells you that you are at risk for this type of infection. Your sexual activity has changed since you were last screened, and you are at increased risk for chlamydia or gonorrhea. Ask your health care provider if you are at risk. Ask your health care provider about whether you are at high risk for HIV. Your health care provider  may recommend a prescription medicine to help prevent HIV infection. If you choose to take medicine to prevent HIV, you should first get tested for HIV. You should then be tested every 3 months for as long as you are taking the medicine. Follow these instructions at home: Alcohol use Do not drink alcohol if your health care provider tells you not to drink. If you drink alcohol: Limit how much you have to 0-2 drinks a day. Know how much alcohol is in your drink. In the [place] one drink equals one 12 oz bottle of beer (355 mL), one 5 oz glass of wine (148 mL), or one 1 oz glass of hard liquor (44 mL). Lifestyle Do not use any products that contain nicotine or tobacco. These products include cigarettes, chewing tobacco, and vaping devices, such as e-cigarettes. If you need help quitting, ask your health care provider. Do not use street drugs. Do not share needles. Ask your health care provider for help if you need support or information about quitting drugs. General instructions Schedule regular health, dental, and eye exams. Stay current with your vaccines. Tell your health care provider if: You often feel depressed. You have ever been abused or do not feel safe at home. Summary Adopting a healthy lifestyle and getting preventive care are important in promoting health and wellness. Follow your health care provider's instructions about healthy diet, exercising, and getting tested or screened for diseases. Follow your health care provider's instructions on monitoring your cholesterol and blood pressure. This information is not intended to replace advice given to you by your health care provider. Make sure you discuss any questions you have with your health care provider. Document Revised: 06/04/2020 Document Reviewed: 06/04/2020 Elsevier Patient Education  2023 [employer]  

## 2021-11-26 NOTE — Progress Notes (Signed)
Subjective:   Fernando Ingram is a 66 y.o. male who presents for an Initial Medicare Annual Wellness Visit.  Review of Systems    Defer to pcp       Objective:    Today's Vitals   11/26/21 1438  BP: 136/81  Pulse: 78  Temp: 97.7 F (36.5 C)  TempSrc: Oral  SpO2: 100%  Weight: 214 lb 12.8 oz (97.4 kg)  Height: 6' 2.5" (1.892 m)   Body mass index is 27.21 kg/m.     11/26/2021    4:54 PM 11/26/2021    4:37 PM 11/26/2021    1:37 PM 05/20/2021    8:35 AM  Advanced Directives  Does Patient Have a Medical Advance Directive? No No No No  Would patient like information on creating a medical advance directive? Yes (MAU/Ambulatory/Procedural Areas - Information given) Yes (MAU/Ambulatory/Procedural Areas - Information given) Yes (MAU/Ambulatory/Procedural Areas - Information given) No - Patient declined    Current Medications (verified) Outpatient Encounter Medications as of 11/26/2021  Medication Sig   atorvastatin (LIPITOR) 20 MG tablet Take 1 tablet (20 mg total) by mouth daily.   dapagliflozin propanediol (FARXIGA) 10 MG TABS tablet Take 1 tablet (10 mg total) by mouth daily before breakfast.   diclofenac Sodium (VOLTAREN) 1 % GEL Apply 2 g topically 4 (four) times daily.   Dulaglutide (TRULICITY) 0.75 MG/0.5ML SOPN Inject 0.75 mg into the skin once a week.   losartan-hydrochlorothiazide (HYZAAR) 100-12.5 MG tablet Take 1 tablet by mouth daily.   metFORMIN (GLUCOPHAGE-XR) 750 MG 24 hr tablet Take 1 tablet (750 mg total) by mouth daily with breakfast.   No facility-administered encounter medications on file as of 11/26/2021.    Allergies (verified) Patient has no known allergies.   History: No past medical history on file. No past surgical history on file. No family history on file. Social History   Socioeconomic History   Marital status: Married    Spouse name: Not on file   Number of children: Not on file   Years of education: Not on file   Highest  education level: Not on file  Occupational History   Not on file  Tobacco Use   Smoking status: Unknown   Smokeless tobacco: Never  Substance and Sexual Activity   Alcohol use: Not on file   Drug use: Not on file   Sexual activity: Not on file  Other Topics Concern   Not on file  Social History Narrative   Not on file   Social Determinants of Health   Financial Resource Strain: Low Risk  (11/26/2021)   Overall Financial Resource Strain (CARDIA)    Difficulty of Paying Living Expenses: Not hard at all  Food Insecurity: No Food Insecurity (11/26/2021)   Hunger Vital Sign    Worried About Running Out of Food in the Last Year: Never true    Ran Out of Food in the Last Year: Never true  Transportation Needs: No Transportation Needs (11/26/2021)   PRAPARE - Administrator, Civil Service (Medical): No    Lack of Transportation (Non-Medical): No  Physical Activity: Sufficiently Active (11/26/2021)   Exercise Vital Sign    Days of Exercise per Week: 5 days    Minutes of Exercise per Session: 60 min  Stress: No Stress Concern Present (11/26/2021)   [employer] of Occupational Health - Occupational Stress Questionnaire    Feeling of Stress : Not at all  Social Connections: Moderately Integrated (11/26/2021)   Social Connection  and Isolation Panel [NHANES]    Frequency of Communication with Friends and Family: More than three times a week    Frequency of Social Gatherings with Friends and Family: More than three times a week    Attends Religious Services: More than 4 times per year    Active Member of [employer] or Organizations: No    Attends Engineer, structural: Never    Marital Status: Married    Tobacco Counseling Counseling given: Not Answered   Clinical Intake:  Pre-visit preparation completed: Yes  Pain : No/denies pain     Diabetes: No  How often do you need to have someone help you when you read instructions, pamphlets, or other written  materials from your doctor or pharmacy?: 1 - Never  Diabetic?no  Interpreter Needed?: No  Information entered by :: [email or web address]   Activities of Daily Living    11/26/2021    4:55 PM 11/26/2021    4:38 PM  In your present state of health, do you have any difficulty performing the following activities:  Hearing?  0  Vision?  1  Difficulty concentrating or making decisions?  0  Walking or climbing stairs?  0  Dressing or bathing?  0  Doing errands, shopping?  0  Preparing Food and eating ? N   Using the Toilet? N   In the past six months, have you accidently leaked urine? N   Do you have problems with loss of bowel control? N   Managing your Medications? N   Managing your Finances? N   Housekeeping or managing your Housekeeping? N     Patient Care Team: Mercie Eon, MD as PCP - General (Internal Medicine)  Indicate any recent Medical Services you may have received from other than [facility] providers in the past year (date may be approximate).     Assessment:   This is a routine wellness examination for Fernando Ingram.  Hearing/Vision screen No results found.  Dietary issues and exercise activities discussed: Current Exercise Habits: The patient has a physically strenuous job, but has no regular exercise apart from work.   Goals Addressed   None   Depression Screen    11/26/2021    4:51 PM 05/20/2021    8:34 AM  PHQ 2/9 Scores  PHQ - 2 Score 0 0  PHQ- 9 Score 3 0    Fall Risk    11/26/2021    4:55 PM 11/26/2021    4:38 PM 11/26/2021    1:33 PM 05/20/2021    8:34 AM  Fall Risk   Falls in the past year? 0 0 0 0  Number falls in past yr: 0 0 0 0  Injury with Fall? 0 0 0 0  Risk for fall due to : No Fall Risks No Fall Risks No Fall Risks No Fall Risks  Follow up Falls evaluation completed Falls evaluation completed Falls evaluation completed Falls prevention discussed;Falls evaluation completed    FALL RISK PREVENTION PERTAINING TO THE HOME:  Any stairs in or  around the home? Yes  If so, are there any without handrails? No  Home free of loose throw rugs in walkways, pet beds, electrical cords, etc? Yes  Adequate lighting in your home to reduce risk of falls? Yes   ASSISTIVE DEVICES UTILIZED TO PREVENT FALLS:  Life alert? No  Use of a cane, walker or w/c? No  Grab bars in the bathroom? Yes  Shower chair or bench in shower? Yes  Elevated toilet seat or a  handicapped toilet? No   TIMED UP AND GO:  Was the test performed? No .  Length of time to ambulate 10 feet: 0 sec.   Gait steady and fast without use of assistive device  Cognitive Function:        11/26/2021    4:56 PM  6CIT Screen  What Year? 0 points  What month? 0 points  What time? 0 points  Count back from 20 0 points  Months in reverse 2 points  Repeat phrase 0 points  Total Score 2 points    Immunizations  There is no immunization history on file for this patient.  TDAP status: Due, Education has been provided regarding the importance of this vaccine. Advised may receive this vaccine at local pharmacy or Health Dept. Aware to provide a copy of the vaccination record if obtained from local pharmacy or Health Dept. Verbalized acceptance and understanding.  Flu Vaccine status: Declined, Education has been provided regarding the importance of this vaccine but patient still declined. Advised may receive this vaccine at local pharmacy or Health Dept. Aware to provide a copy of the vaccination record if obtained from local pharmacy or Health Dept. Verbalized acceptance and understanding.  Pneumococcal vaccine status: Declined,  Education has been provided regarding the importance of this vaccine but patient still declined. Advised may receive this vaccine at local pharmacy or Health Dept. Aware to provide a copy of the vaccination record if obtained from local pharmacy or Health Dept. Verbalized acceptance and understanding.   Covid-19 vaccine status: Declined, Education has  been provided regarding the importance of this vaccine but patient still declined. Advised may receive this vaccine at local pharmacy or Health Dept.or vaccine clinic. Aware to provide a copy of the vaccination record if obtained from local pharmacy or Health Dept. Verbalized acceptance and understanding.  Qualifies for Shingles Vaccine? Yes   Zostavax completed No   Shingrix Completed?: No.    Education has been provided regarding the importance of this vaccine. Patient has been advised to call insurance company to determine out of pocket expense if they have not yet received this vaccine. Advised may also receive vaccine at local pharmacy or Health Dept. Verbalized acceptance and understanding.  Screening Tests Health Maintenance  Topic Date Due   COVID-19 Vaccine (1) Never done   OPHTHALMOLOGY EXAM  Never done   TETANUS/TDAP  01/27/2022 (Originally 12/17/1974)   Zoster Vaccines- Shingrix (1 of 2) 02/26/2022 (Originally 12/18/2003)   INFLUENZA VACCINE  04/27/2022 (Originally 08/27/2021)   Pneumonia Vaccine 74+ Years old (1 - PCV) 05/21/2022 (Originally 12/18/2018)   Diabetic kidney evaluation - Urine ACR  03/26/2022   Diabetic kidney evaluation - GFR measurement  05/21/2022   HEMOGLOBIN A1C  05/27/2022   FOOT EXAM  11/27/2022   Medicare Annual Wellness (AWV)  11/27/2022   COLONOSCOPY (Pts 45-5yrs Insurance coverage will need to be confirmed)  08/27/2026   Hepatitis C Screening  Completed   HPV VACCINES  Aged Out    Health Maintenance  Health Maintenance Due  Topic Date Due   COVID-19 Vaccine (1) Never done   OPHTHALMOLOGY EXAM  Never done    Colorectal cancer screening: Type of screening: Colonoscopy. Completed 08/26/2016. Repeat every 10 years  Lung Cancer Screening: (Low Dose CT Chest recommended if Age 56-80 years, 30 pack-year currently smoking OR have quit w/in 15years.) does not qualify.   Lung Cancer Screening Referral: n/a  Additional Screening:  Hepatitis C  Screening: does not qualify; Completed 05/20/2021  Vision Screening:  Recommended annual ophthalmology exams for early detection of glaucoma and other disorders of the eye. Is the patient up to date with their annual eye exam?   Has appt 12/31/21 [facility]) Who is the provider or what is the name of the office in which the patient attends annual eye exams? [facility] If pt is not established with a provider, would they like to be referred to a provider to establish care? Yes .   Dental Screening: Recommended annual dental exams for proper oral hygiene  Community Resource Referral / Chronic Care Management: CRR required this visit?  No   CCM required this visit?  No      Plan:     I have personally reviewed and noted the following in the patient's chart:   Medical and social history Use of alcohol, tobacco or illicit drugs  Current medications and supplements including opioid prescriptions. Patient is not currently taking opioid prescriptions. Functional ability and status Nutritional status Physical activity Advanced directives List of other physicians Hospitalizations, surgeries, and ER visits in previous 12 months Vitals Screenings to include cognitive, depression, and falls Referrals and appointments  In addition, I have reviewed and discussed with patient certain preventive protocols, quality metrics, and best practice recommendations. A written personalized care plan for preventive services as well as general preventive health recommendations were provided to patient.     Nicoletta Dress, [place]   11/26/2021   Nurse Notes: face to face 15 minutes  Mr. Kistler , Thank you for taking time to come for your Medicare Wellness Visit. I appreciate your ongoing commitment to your health goals. Please review the following plan we discussed and let me know if I can assist you in the future.   These are the goals we discussed:  Goals   None      This is a list of the screening recommended for you and due dates:  Health Maintenance  Topic Date Due   COVID-19 Vaccine (1) Never done   Eye exam for diabetics  Never done   Tetanus Vaccine  01/27/2022*   Zoster (Shingles) Vaccine (1 of 2) 02/26/2022*   Flu Shot  04/27/2022*   Pneumonia Vaccine (1 - PCV) 05/21/2022*   Yearly kidney health urinalysis for diabetes  03/26/2022   Yearly kidney function blood test for diabetes  05/21/2022   Hemoglobin A1C  05/27/2022   Complete foot exam   11/27/2022   Medicare Annual Wellness Visit  11/27/2022   Colon Cancer Screening  08/27/2026   Hepatitis C Screening: USPSTF Recommendation to screen - Ages 18-79 yo.  Completed   HPV Vaccine  Aged Out  *Topic was postponed. The date shown is not the original due date.

## 2021-11-27 LAB — BMP8+ANION GAP
Anion Gap: 16 mmol/L (ref 10.0–18.0)
BUN/Creatinine Ratio: 18 (ref 10–24)
BUN: 21 mg/dL (ref 8–27)
CO2: 22 mmol/L (ref 20–29)
Calcium: 9.7 mg/dL (ref 8.6–10.2)
Chloride: 100 mmol/L (ref 96–106)
Creatinine, Ser: 1.18 mg/dL (ref 0.76–1.27)
Glucose: 167 mg/dL — ABNORMAL HIGH (ref 70–99)
Potassium: 3.9 mmol/L (ref 3.5–5.2)
Sodium: 138 mmol/L (ref 134–144)
eGFR: 68 mL/min/{1.73_m2} (ref >59)

## 2021-11-27 LAB — MICROALBUMIN / CREATININE URINE RATIO
Creatinine, Urine: 196.1 mg/dL
Microalb/Creat Ratio: 53 mg/g creat — ABNORMAL HIGH (ref 0–29)
Microalbumin, Urine: 104.8 ug/mL

## 2021-11-28 ENCOUNTER — Encounter: Payer: Self-pay | Admitting: Student

## 2021-11-28 DIAGNOSIS — M7541 Impingement syndrome of right shoulder: Secondary | ICD-10-CM | POA: Insufficient documentation

## 2021-11-28 NOTE — Assessment & Plan Note (Addendum)
BP Readings from Last 3 Encounters:  11/26/21 136/81  11/26/21 136/81  05/20/21 128/74   Blood pressure currently on combination pill with losartan and hydrochlorothiazide. Denies chest pain, dyspnea, lightheadedness, syncopal episodes. Continue with current regimen, hopeful to gain even better control with some weight loss with GLP-1.  - Losartan-hydrochlorothiazide 100-12.5mg  daily - BMP today

## 2021-11-28 NOTE — Assessment & Plan Note (Signed)
Fernando Ingram reports he has been out of his metformin and [name] for a few weeks now. Does report that he has been experiencing an increase in polyuria and polydipsia recently. Denies any neuropathic symptoms or any vision changes. Patient reports was previously controlled using mainly diet, although he has not been eating as well recently.   A1c significantly elevated to 11.0% today from 5.8% during last check. Discussed the importance of good glycemic control with Fernando Ingram. Also discussed possibility of insulin in the future, he would like to avoid this if at all possible. Previously on increased dose of metformin and did not tolerate as well, we will continue with current dosage. He is interested in meeting with our diabetes educator, I will place this referral as well. If sugars are not adequately controlled at his next visit would consider CGM, adding long-acting insulin if needed and if agreeable.   - Continue metformin 750mg  long acting daily - Continue Farxiga 10mg  daily - Start Trulicity 0.75mg  weekly injections, up-titrate after four weeks - UMicroalbumin/creatinine today  - Repeat A1c in three months

## 2021-11-28 NOTE — Progress Notes (Signed)
CC: diabetes follow-up  HPI:  [name] is a 66 y.o. person with medical history as below presenting to [facility] for diabetes follow-up.   Please see problem-based list for further details, assessments, and plans.  Past Medical History:  Diagnosis Date   Hyperlipidemia    Hypertension    Type 2 diabetes mellitus (HCC)    Review of Systems:  As per HPI  Physical Exam:  Vitals:   11/26/21 1330 11/26/21 1438  BP: (!) 143/85 136/81  Pulse: 71 78  Temp: 97.7 F (36.5 C)   TempSrc: Oral   SpO2: 100%   Weight: 214 lb 12.8 oz (97.4 kg)   Height: 6' 2.5" (1.892 m)    General: Resting comfortably in no acute distress CV: Regular rate, rhythm. No murmurs appreciated. Distal pulses 2+ bilaterally.  Pulm: Normal work of breathing on room air. Clear to auscultation bilaterally.  MSK: Normal bulk, tone. R shoulder without bony tenderness or abnormalities on palpation. Mild anterior tenderness near Active and passive adduction limited by pain, otherwise range of motion normal. Hawkins test, empty can test positive on R.  Skin: Warm, dry. No rashes or lesions appreciated.  Neuro: Awake, alert, conversing appropriately. Grossly non-focal.  Assessment & Plan:   Hypertension BP Readings from Last 3 Encounters:  11/26/21 136/81  11/26/21 136/81  05/20/21 128/74   Blood pressure currently on combination pill with losartan and hydrochlorothiazide. Denies chest pain, dyspnea, lightheadedness, syncopal episodes. Continue with current regimen, hopeful to gain even better control with some weight loss with GLP-1.  - Losartan-hydrochlorothiazide 100-12.5mg  daily - BMP today  Diabetes mellitus [facility]) Fernando Ingram reports he has been out of his metformin and Marcelline Deist for a few weeks now. Does report that he has been experiencing an increase in polyuria and polydipsia recently. Denies any neuropathic symptoms or any vision changes. Patient reports was previously controlled using mainly  diet, although he has not been eating as well recently.   A1c significantly elevated to 11.0% today from 5.8% during last check. Discussed the importance of good glycemic control with Fernando Ingram. Also discussed possibility of insulin in the future, he would like to avoid this if at all possible. Previously on increased dose of metformin and did not tolerate as well, we will continue with current dosage. He is interested in meeting with our diabetes educator, I will place this referral as well. If sugars are not adequately controlled at his next visit would consider CGM, adding long-acting insulin if needed and if agreeable.   - Continue metformin 750mg  long acting daily - Continue Farxiga 10mg  daily - Start Trulicity 0.75mg  weekly injections, up-titrate after four weeks - UMicroalbumin/creatinine today  - Repeat A1c in three months  Impingement syndrome of right shoulder Fernando Ingram reports his right shoulder has been bothering him over the last few weeks. Typically has a degree of chronic right shoulder pain, but this has worsened over this time. Denies any heavy lifting, tearing, injuries, or trauma. States that he feels overall his shoulder has been much less functional since this started. He has been taking one Tylenol in the morning which has not helped much.  Examination most consistent with impingement syndrome, possibly of rotator cuff. Discussed conservative measures, including ice, heat, voltaren gel. Encouraged him to take scheduled Tylenol three times daily, only taking NSAID on days where the pain is the worst.   - Voltaren gel ordered - Tylenol 1000mg  three times daily - Alternate ice, heat - Return to clinic if pain  worsens  Patient discussed with Dr. Kathrynn Ducking, MD Internal Medicine PGY-3 Pager: [phone]

## 2021-11-28 NOTE — Progress Notes (Signed)
Internal Medicine Clinic Attending ? ?Case discussed with Dr. Braswell  At the time of the visit.  We reviewed the resident?s history and exam and pertinent patient test results.  I agree with the assessment, diagnosis, and plan of care documented in the resident?s note.  ?

## 2021-11-28 NOTE — Addendum Note (Signed)
Addended by: Velna Ochs R on: 11/28/2021 03:00 PM   Modules accepted: Level of Service

## 2021-11-28 NOTE — Assessment & Plan Note (Addendum)
Mr. Dinapoli reports his right shoulder has been bothering him over the last few weeks. Typically has a degree of chronic right shoulder pain, but this has worsened over this time. Denies any heavy lifting, tearing, injuries, or trauma. States that he feels overall his shoulder has been much less functional since this started. He has been taking one Tylenol in the morning which has not helped much.  Examination most consistent with impingement syndrome, possibly of rotator cuff. Discussed conservative measures, including ice, heat, voltaren gel. Encouraged him to take scheduled Tylenol three times daily, only taking NSAID on days where the pain is the worst.   - Voltaren gel ordered - Tylenol 1000mg  three times daily - Alternate ice, heat - Return to clinic if pain worsens

## 2021-12-03 ENCOUNTER — Encounter: Payer: Self-pay | Admitting: Student

## 2021-12-03 NOTE — Addendum Note (Signed)
Addended by: Markian Glockner on: 12/03/2021 03:39 PM   Modules accepted: Level of Service  

## 2021-12-04 NOTE — Progress Notes (Signed)
Internal Medicine Clinic Attending  Case and documentation of Dr. Braswell reviewed.  I reviewed the AWV findings.  I agree with the assessment, diagnosis, and plan of care documented in the AWV note.     

## 2021-12-31 LAB — HM DIABETES EYE EXAM

## 2022-02-11 ENCOUNTER — Other Ambulatory Visit: Payer: Self-pay | Admitting: Student

## 2022-02-11 ENCOUNTER — Encounter: Payer: Medicare Other | Admitting: Dietician

## 2022-02-11 DIAGNOSIS — I10 Essential (primary) hypertension: Secondary | ICD-10-CM

## 2022-02-11 DIAGNOSIS — E119 Type 2 diabetes mellitus without complications: Secondary | ICD-10-CM

## 2022-02-14 NOTE — Telephone Encounter (Signed)
Next appt scheduled 02/26/22 with PCP.

## 2022-02-24 NOTE — Progress Notes (Deleted)
[name]: Clinic Note  Subjective:  History of Present Illness: Fernando Ingram is a 67 y.o. year old male who presents for ***  Seen 2x in this clinic, new to me Last visit 11/26/21 for T2DM with Braswell  # HTN - losartan, HCTZ  # T2DM - added GLP1 last time - last time, A1C was 5.8 to 11.0 - metformin '750mg'$  daily (the most he can tolerate) - farxiga '10mg'$  daily (incrase it??) - trulicity 0.'75mg'$  weekly - increase it - eye exam   # R shoulder impingment syndrome - apap & voltaren gel    HCM - due for colonoscopy    Please refer to Assessment and Plan below for full details in Problem-Based Charting.   Past Medical History:  Patient Active Problem List   Diagnosis Date Noted   Impingement syndrome of right shoulder 11/28/2021   Normocytic anemia 05/22/2021   Hypertension 05/20/2021   Hyperlipidemia 05/20/2021   Diabetes mellitus ([place]) 05/20/2021   Healthcare maintenance 05/20/2021   Family history of colon cancer 05/20/2021   Right shoulder pain 05/20/2021   Onychomycosis 03/23/2018    Social History: Reviewed in [place]. Pertinent updates today include: ***  Family History: Reviewed in Epic. Pertinent updates today include: None  Medications:  Current Outpatient Medications:    atorvastatin (LIPITOR) 20 MG tablet, TAKE 1 TABLET BY MOUTH EVERY DAY, Disp: 90 tablet, Rfl: 0   dapagliflozin propanediol (FARXIGA) 10 MG TABS tablet, Take 1 tablet (10 mg total) by mouth daily before breakfast., Disp: 90 tablet, Rfl: 0   diclofenac Sodium (VOLTAREN) 1 % GEL, Apply 2 g topically 4 (four) times daily., Disp: 50 g, Rfl: 2   Dulaglutide (TRULICITY) A999333 0000000 SOPN, INJECT 0.75 MG SUBCUTANEOUSLY ONE TIME PER WEEK, Disp: 2 mL, Rfl: 2   losartan-hydrochlorothiazide (HYZAAR) 100-12.5 MG tablet, TAKE 1 TABLET BY MOUTH EVERY DAY, Disp: 90 tablet, Rfl: 0   metFORMIN (GLUCOPHAGE-XR) 750 MG 24 hr tablet, TAKE 1 TABLET BY MOUTH EVERY DAY WITH BREAKFAST,  Disp: 90 tablet, Rfl: 0   Allergies: No Known Allergies  Review of Systems: ROS   Objective:   Vitals: There were no vitals filed for this visit.  Physical Exam: Physical Exam   Data: Labs, imaging, and micro were reviewed in Epic. Refer to Assessment and Plan below for full details in Problem-Based Charting.  Assessment & Plan:  No problem-specific Assessment & Plan notes found for this encounter.     Patient will follow up in ***  Lottie Mussel, MD

## 2022-02-26 ENCOUNTER — Encounter: Payer: Medicare Other | Admitting: Internal Medicine

## 2022-04-08 NOTE — Progress Notes (Unsigned)
[facility]: Clinic Note  Subjective:  History of Present Illness: Fernando Ingram is a 67 y.o. year old male who presents for routine follow up of his chronic medical conditions. I am his new PCP, but have not seen him yet. Last seen in our clinic on 11/26/21 by Dr. Collene Gobble.  He has no new concerns or questions today.   Please refer to Assessment and Plan below for full details in Problem-Based Charting.   Past Medical History:  Patient Active Problem List   Diagnosis Date Noted   Impingement syndrome of right shoulder 11/28/2021   Normocytic anemia 05/22/2021   Hypertension 05/20/2021   Hyperlipidemia 05/20/2021   Diabetes mellitus ([place]) 05/20/2021   Healthcare maintenance 05/20/2021   Family history of colon cancer 05/20/2021   Onychomycosis 03/23/2018     Medications:  Current Outpatient Medications:    metFORMIN (GLUCOPHAGE) 1000 MG tablet, Take 1 tablet (1,000 mg total) by mouth 2 (two) times daily with a meal., Disp: 180 tablet, Rfl: 3   atorvastatin (LIPITOR) 20 MG tablet, Take 1 tablet (20 mg total) by mouth daily., Disp: 90 tablet, Rfl: 3   dapagliflozin propanediol (FARXIGA) 10 MG TABS tablet, Take 1 tablet (10 mg total) by mouth daily before breakfast., Disp: 90 tablet, Rfl: 0   diclofenac Sodium (VOLTAREN) 1 % GEL, Apply 2 g topically 4 (four) times daily., Disp: 50 g, Rfl: 2   Dulaglutide (TRULICITY) A999333 0000000 SOPN, INJECT 0.75 MG SUBCUTANEOUSLY ONE TIME PER WEEK, Disp: 2 mL, Rfl: 2   losartan-hydrochlorothiazide (HYZAAR) 100-12.5 MG tablet, Take 1 tablet by mouth daily., Disp: 90 tablet, Rfl: 3   Allergies: No Known Allergies  Objective:   Vitals: Vitals:   04/09/22 0937  BP: (!) 113/58  Pulse: 80  Temp: 98 F (36.7 C)      Physical Exam: Physical Exam Constitutional:      Appearance: Normal appearance.  Cardiovascular:     Rate and Rhythm: Normal rate and regular rhythm.     Heart sounds: Normal heart sounds.   Pulmonary:     Effort: No respiratory distress.     Breath sounds: Normal breath sounds. No wheezing or rhonchi.  Musculoskeletal:     Right lower leg: No edema.     Left lower leg: No edema.  Skin:    General: Skin is warm and dry.  Neurological:     Mental Status: He is alert.      Data: Labs, imaging, and micro were reviewed in Epic. Refer to Assessment and Plan below for full details in Problem-Based Charting.  Assessment & Plan:  Diabetes mellitus ([place]) - A1C went from 11.0 last time to 9.2 today, but patient had run out of trulicity, so hadn't been taking this - I have placed referral to Diabetes Educator - Continue Farxiga '10mg'$  daily - Restart Trulicity 0.'75mg'$  weekly, and uptitrate at next visit - Increase Metformin to '1000mg'$  BID. Patient counseled on possible GI side effects, and will call if he experiences this - he would like to avoid insulin if at all possible - Continue statin - He has +urine MACR, and is already on max ARB and SGLT2. Continue these - He sees an outside ophthalmologist for diabetic retinopathy screening  Hypertension - Well controlled today. He's not experiencing dizziness, chest pain, or shortness of breath. He has not eaten this morning yet - Continue Losartan '100mg'$  - HCTZ 12.'5mg'$  daily   Impingement syndrome of right shoulder - This is chronic and improving -  Continue Voltaren gel and PT     Patient will follow up in 3 months  Lottie Mussel, MD

## 2022-04-09 ENCOUNTER — Ambulatory Visit (INDEPENDENT_AMBULATORY_CARE_PROVIDER_SITE_OTHER): Payer: Medicare Other | Admitting: Internal Medicine

## 2022-04-09 ENCOUNTER — Encounter: Payer: Self-pay | Admitting: Internal Medicine

## 2022-04-09 VITALS — BP 113/58 | HR 80 | Temp 98.0°F | Ht 75.0 in | Wt 223.0 lb

## 2022-04-09 DIAGNOSIS — Z7984 Long term (current) use of oral hypoglycemic drugs: Secondary | ICD-10-CM

## 2022-04-09 DIAGNOSIS — Z794 Long term (current) use of insulin: Secondary | ICD-10-CM | POA: Diagnosis not present

## 2022-04-09 DIAGNOSIS — E119 Type 2 diabetes mellitus without complications: Secondary | ICD-10-CM | POA: Diagnosis not present

## 2022-04-09 DIAGNOSIS — I10 Essential (primary) hypertension: Secondary | ICD-10-CM

## 2022-04-09 DIAGNOSIS — M7541 Impingement syndrome of right shoulder: Secondary | ICD-10-CM | POA: Diagnosis not present

## 2022-04-09 LAB — GLUCOSE, CAPILLARY: Glucose-Capillary: 126 mg/dL — ABNORMAL HIGH (ref 70–99)

## 2022-04-09 LAB — POCT GLYCOSYLATED HEMOGLOBIN (HGB A1C): Hemoglobin A1C: 9.2 % — AB (ref 4.0–5.6)

## 2022-04-09 MED ORDER — DAPAGLIFLOZIN PROPANEDIOL 10 MG PO TABS: 10.0000 mg | ORAL_TABLET | Freq: Every day | ORAL | 0 refills | Status: DC

## 2022-04-09 MED ORDER — METFORMIN HCL 1000 MG PO TABS: 1000.0000 mg | ORAL_TABLET | Freq: Two times a day (BID) | ORAL | 3 refills | Status: DC

## 2022-04-09 MED ORDER — TRULICITY 0.75 MG/0.5ML ~~LOC~~ SOAJ: SUBCUTANEOUS | 2 refills | Status: DC

## 2022-04-09 MED ORDER — LOSARTAN POTASSIUM-HCTZ 100-12.5 MG PO TABS: 1.0000 | ORAL_TABLET | Freq: Every day | ORAL | 3 refills | Status: DC

## 2022-04-09 MED ORDER — DICLOFENAC SODIUM 1 % EX GEL: 2.0000 g | Freq: Four times a day (QID) | CUTANEOUS | 2 refills | Status: AC

## 2022-04-09 MED ORDER — ATORVASTATIN CALCIUM 20 MG PO TABS: 20.0000 mg | ORAL_TABLET | Freq: Every day | ORAL | 3 refills | Status: DC

## 2022-04-09 NOTE — Patient Instructions (Addendum)
Dear Mr Malhi,  It was a pleasure seeing you in clinic today.  Your A1C today was 9.2, which is high. I'd like to see this number at 7. We are going to do 3 things for your diabetes: 1.) I have referred you to see a diabetes specialist for nutrition 2.) Restart Trulicity at 0.'75mg'$  weekly. If you run out, please call us to get more 3.) Increase your metformin. You will take a '1000mg'$  pill once in the morning and once in the evening. I have sent this to your pharmacy.   If you experience any nausea, vomiting, or diarrhea, please call our office, and I will adjust your medicines.   I will follow up with you in 3 months.   Sincerely, Dr. Lottie Mussel

## 2022-04-09 NOTE — Assessment & Plan Note (Signed)
-   Well controlled today. He's not experiencing dizziness, chest pain, or shortness of breath. He has not eaten this morning yet - Continue Losartan 100mg  - HCTZ 12.5mg  daily

## 2022-04-09 NOTE — Assessment & Plan Note (Signed)
-   This is chronic and improving - Continue Voltaren gel and PT

## 2022-04-09 NOTE — Assessment & Plan Note (Signed)
-   A1C went from 11.0 last time to 9.2 today, but patient had run out of trulicity, so hadn't been taking this - I have placed referral to Diabetes Educator - Continue Farxiga 10mg  daily - Restart Trulicity 0.75mg  weekly, and uptitrate at next visit - Increase Metformin to 1000mg  BID. Patient counseled on possible GI side effects, and will call if he experiences this - he would like to avoid insulin if at all possible - Continue statin - He has +urine MACR, and is already on max ARB and SGLT2. Continue these - He sees an outside ophthalmologist for diabetic retinopathy screening

## 2022-05-07 ENCOUNTER — Ambulatory Visit (INDEPENDENT_AMBULATORY_CARE_PROVIDER_SITE_OTHER): Payer: Medicare Other | Admitting: Dietician

## 2022-05-07 VITALS — Wt 216.0 lb

## 2022-05-07 DIAGNOSIS — E119 Type 2 diabetes mellitus without complications: Secondary | ICD-10-CM | POA: Diagnosis not present

## 2022-05-07 NOTE — Patient Instructions (Signed)
Good Job lowering your blood sugars!  We made some suggestions that should help it stay down!  Keep up the good work!  Lupita Leash [phone]

## 2022-05-07 NOTE — Progress Notes (Addendum)
  Medical Nutrition Therapy ( corrected documentation of in person visit not Telemedicine):  Appt start time: 09:30 end time:  10:30. Total time: 60 minutes Visit # 1  Assessment:  Primary concerns today:  Patient states he want to learn about a good diet plan.  Preferred Learning Style:  No preference indicated  Learning Readiness: Ready  ANTHROPOMETRICS: weight 216 lb, He has lost 7 lb   Estimated body mass index is 27 kg/m as calculated from the following:   Height as of 04/09/22: 6\' 3"  (1.905 m).   Weight as of this encounter: 216 lb (98 kg).   Wt Readings from Last 20 Encounters:  04/09/22 223 lb (101.2 kg)  11/26/21 214 lb 12.8 oz (97.4 kg)  11/26/21 214 lb 12.8 oz (97.4 kg)  05/20/21 212 lb 14.4 oz (96.6 kg)    WEIGHT HISTORY: Highest: 223 Lowest 216 SLEEP:.assess in next visit  MEDICATIONS:  Farxiga 10 mg Trulicity 0.75 Glucophage 1000 once a day  BLOOD SUGAR:METER DOWNLOAD  04/08/2022-05/07/2022 03/09/2022-04/07/2022  Overall Avg  124 mg/dl 161 mg/dl  %in target range 09.6 %   %above target 32.6%   Range 86 mg/dl - 045 mg/dl       Notes about patterns:  Flat patterns.   DIETARY INTAKE: Usual eating pattern includes 4 meals and 3 snacks per day. Everyday foods include Smoothie .  Avoided foods include   Food Intolerances: none Dining Out (times/week): asset in the next visit. 24-hr recall:  B ( AM): 5:00 am Coffee, danish and smoothie (lemon, beet, ginger, apple carrots, celery) Snk ( AM): 8-9 Bisque, sausage, ham, coffee  L ( PM): Healthy meal, shrimps, fried rice, broccoli Snk ( PM): none D ( PM): Sausage, red hot, bread, mustard Snk ( PM): smoothie (lemon, beet, ginger, apple carrots, celery) Beverages: water, coffee,   Usual physical activity: Work and ADL    Progress Towards Goal(s):  In progress.   Nutritional Diagnosis:  NB-1.1 Food and nutrition-related knowledge deficit As related to no diabetes training .  As evidenced by Hb A1C 9.2.     Intervention:  Nutrition  Better food choices, try to improve nutrient density. Eat more vegetables and fruit, and less fatty food. Avoid process food. Reading labels  Action Goal: Eat vegetables and fruit at lunch and dinner. Decrease portion sizes.  Outcome goal: Increase knowledge about good nutrition and decrease Hb A1C Coordination of care: None  Teaching Method Utilized: Visual, Auditory,Hands on Handouts given during visit include:none Barriers to learning/adherence to lifestyle change: none Demonstrated degree of understanding via:  Teach Back   Monitoring/Evaluation:  Dietary intake, exercise, Hb A1C and body weight in 4 week(s).

## 2022-05-09 NOTE — Progress Notes (Signed)
Reviewed patient's glucometer log. Has been in range 2/3 of time. Above target 1/3 of time. No lows. Average glucose 124. My plan had been to increase his Trulicity - will call him to discuss.

## 2022-06-04 ENCOUNTER — Encounter: Payer: Self-pay | Admitting: Dietician

## 2022-06-17 ENCOUNTER — Encounter: Payer: Self-pay | Admitting: Dietician

## 2022-06-30 NOTE — Progress Notes (Signed)
Reviewed Ophtho note from Dr Harriette Bouillon He has hyperopia & refection in OU  No diabetic retinopathy seen

## 2022-07-07 NOTE — Progress Notes (Deleted)
[facility]: Clinic Note  Subjective:  History of Present Illness: OSMAN CALZADILLA is a 67 y.o. year old male who presents for 3 month follow-up for his chronic medical conditions, mainly diabetes. I met him in March.  # T2DM - A1C 9.2 to xxx - He saw Lupita Leash in April but missed his May appointment - Farxiga 10mg  daily, Trulicity 0.75mg  weekly (increase!), Metformin 1000mg  BID - statin  # HTN - losartan 100, hctz 25  HCM - pneumovax - tdap     Please refer to Assessment and Plan below for full details in Problem-Based Charting.   Past Medical History:  Patient Active Problem List   Diagnosis Date Noted   Impingement syndrome of right shoulder 11/28/2021   Normocytic anemia 05/22/2021   Hypertension 05/20/2021   Hyperlipidemia 05/20/2021   Diabetes mellitus (HCC) 05/20/2021   Healthcare maintenance 05/20/2021   Family history of colon cancer 05/20/2021   Onychomycosis 03/23/2018      Medications:  Current Outpatient Medications:    atorvastatin (LIPITOR) 20 MG tablet, Take 1 tablet (20 mg total) by mouth daily., Disp: 90 tablet, Rfl: 3   dapagliflozin propanediol (FARXIGA) 10 MG TABS tablet, Take 1 tablet (10 mg total) by mouth daily before breakfast., Disp: 90 tablet, Rfl: 0   diclofenac Sodium (VOLTAREN) 1 % GEL, Apply 2 g topically 4 (four) times daily., Disp: 50 g, Rfl: 2   Dulaglutide (TRULICITY) 0.75 MG/0.5ML SOPN, INJECT 0.75 MG SUBCUTANEOUSLY ONE TIME PER WEEK, Disp: 2 mL, Rfl: 2   losartan-hydrochlorothiazide (HYZAAR) 100-12.5 MG tablet, Take 1 tablet by mouth daily., Disp: 90 tablet, Rfl: 3   metFORMIN (GLUCOPHAGE) 1000 MG tablet, Take 1 tablet (1,000 mg total) by mouth 2 (two) times daily with a meal., Disp: 180 tablet, Rfl: 3   Allergies: No Known Allergies     Objective:   Vitals: There were no vitals filed for this visit.   Physical Exam: Physical Exam   Data: Labs, imaging, and micro were reviewed in Epic. Refer to  Assessment and Plan below for full details in Problem-Based Charting.  Assessment & Plan:  No problem-specific Assessment & Plan notes found for this encounter.     Patient will follow up in ***  Mercie Eon, MD

## 2022-07-09 ENCOUNTER — Encounter: Payer: Medicare Other | Admitting: Internal Medicine

## 2022-11-04 NOTE — Progress Notes (Unsigned)
[facility]: Clinic Note  Subjective:  History of Present Illness: Fernando Ingram is a 67 y.o. year old male who presents for 6 month follow up of his chronic medical conditions. I saw him last in March.  He has 2 concerns today.  First is right knee pain, present for several months. No preceding trauma. Knee does not lock. Pain is worse with ambulation. He has had similar knee pain in the past, got injections, not steroid, that were helpful - he'd like to try this again. Has been using voltaren gel with some relief. No falls.   Second concern is left sided neck pain for 1-2 weeks. No preceding trauma. No numbness, tingling, weakness in his arms or hands. Pain is worse in the morning when he wakes up and moves his head. Also with with twisting his head.   A1C down to 6.4!   Please refer to Assessment and Plan below for full details in Problem-Based Charting.   Past Medical History:  Patient Active Problem List   Diagnosis Date Noted   Neck pain on left side 11/05/2022   Right knee pain 11/05/2022   Impingement syndrome of right shoulder 11/28/2021   Normocytic anemia 05/22/2021   Hypertension 05/20/2021   Hyperlipidemia 05/20/2021   Diabetes mellitus (HCC) 05/20/2021   Healthcare maintenance 05/20/2021   Family history of colon cancer 05/20/2021   Onychomycosis 03/23/2018     Medications:  Current Outpatient Medications:    glucose blood test strip, Use as instructed, Disp: 100 each, Rfl: 12   naproxen (NAPROSYN) 500 MG tablet, Take 1 tablet (500 mg total) by mouth 2 (two) times daily with a meal for 14 days., Disp: 28 tablet, Rfl: 0   atorvastatin (LIPITOR) 20 MG tablet, Take 1 tablet (20 mg total) by mouth daily., Disp: 90 tablet, Rfl: 3   dapagliflozin propanediol (FARXIGA) 10 MG TABS tablet, Take 1 tablet (10 mg total) by mouth daily before breakfast., Disp: 90 tablet, Rfl: 0   diclofenac Sodium (VOLTAREN) 1 % GEL, Apply 2 g topically 4 (four)  times daily., Disp: 50 g, Rfl: 2   Dulaglutide (TRULICITY) 0.75 MG/0.5ML SOPN, INJECT 0.75 MG SUBCUTANEOUSLY ONE TIME PER WEEK, Disp: 2 mL, Rfl: 2   losartan-hydrochlorothiazide (HYZAAR) 100-12.5 MG tablet, Take 1 tablet by mouth daily., Disp: 90 tablet, Rfl: 3   metFORMIN (GLUCOPHAGE) 1000 MG tablet, Take 1 tablet (1,000 mg total) by mouth daily with breakfast., Disp: 90 tablet, Rfl: 3   Allergies: No Known Allergies     Objective:   Vitals: Vitals:   11/05/22 0845  BP: 113/69  Pulse: 70  Temp: 97.8 F (36.6 C)  SpO2: 100%     Physical Exam: Physical Exam Constitutional:      Appearance: Normal appearance.  Pulmonary:     Effort: Pulmonary effort is normal. No respiratory distress.  Musculoskeletal:     Comments: +Spurling's test on left of neck. R knee without tenderness to palpation, no effusion, full rom in tact  Neurological:     Mental Status: He is alert.      Data: Labs, imaging, and micro were reviewed in Epic. Refer to Assessment and Plan below for full details in Problem-Based Charting.  Assessment & Plan:  Hypertension - Well controled today - Continue Losartan 100-HCT 12.5mg  daily  - BMP  Diabetes mellitus (HCC) - A1C down to 6.4 today! From 9.2 last time - Continue Farxiga 10mg  daily, Trulicity 0.75mg  weekly, and Metformin 1g daily.  - Continue the  excellent lifestyle changes he has made - Continue statin - Urine MACR today   Healthcare maintenance - He politely declined pneumococcal and flu vaccines today   Neck pain on left side - Patient has had left sided neck pain for 1-2 weeks. No preceding trauma. No numbness, tingling, weakness in his arms or hands. Pain is worse in the morning when he wakes up and moves his head. Also with with twisting his head. I think this is cervical strain vs spondylosis - Naproxen 500mg  BID - neck stretches, I gave him a print out - Call if not improved - next step would be PT, consider x-rays and muscle relaxant    Right knee pain - Patient has had several months of right knee pain, no preceding trauma, worse with ambulation.  - Continue voltaren gel  - He found injections helpful previously (not steroid, I don't have the records) - he will call that office to get this scheduled again - I offered PT, he will call if he'd like to do PT     Patient will follow up in 6 months   Mercie Eon, MD

## 2022-11-05 ENCOUNTER — Ambulatory Visit: Payer: Medicare HMO | Admitting: Internal Medicine

## 2022-11-05 ENCOUNTER — Encounter: Payer: Self-pay | Admitting: Internal Medicine

## 2022-11-05 VITALS — BP 113/69 | HR 70 | Temp 97.8°F | Ht 75.0 in | Wt 220.9 lb

## 2022-11-05 DIAGNOSIS — Z7984 Long term (current) use of oral hypoglycemic drugs: Secondary | ICD-10-CM | POA: Diagnosis not present

## 2022-11-05 DIAGNOSIS — M542 Cervicalgia: Secondary | ICD-10-CM

## 2022-11-05 DIAGNOSIS — I10 Essential (primary) hypertension: Secondary | ICD-10-CM

## 2022-11-05 DIAGNOSIS — E119 Type 2 diabetes mellitus without complications: Secondary | ICD-10-CM | POA: Diagnosis not present

## 2022-11-05 DIAGNOSIS — M25561 Pain in right knee: Secondary | ICD-10-CM | POA: Diagnosis not present

## 2022-11-05 DIAGNOSIS — Z0000 Encounter for general adult medical examination without abnormal findings: Secondary | ICD-10-CM

## 2022-11-05 LAB — POCT GLYCOSYLATED HEMOGLOBIN (HGB A1C): Hemoglobin A1C: 6.4 % — AB (ref 4.0–5.6)

## 2022-11-05 LAB — GLUCOSE, CAPILLARY: Glucose-Capillary: 168 mg/dL — ABNORMAL HIGH (ref 70–99)

## 2022-11-05 MED ORDER — METFORMIN HCL 1000 MG PO TABS: 1000.0000 mg | ORAL_TABLET | Freq: Every day | ORAL | 3 refills | Status: DC

## 2022-11-05 MED ORDER — TRULICITY 0.75 MG/0.5ML ~~LOC~~ SOAJ
SUBCUTANEOUS | 2 refills | Status: DC
Start: 2022-11-05 — End: 2023-05-13

## 2022-11-05 MED ORDER — NAPROXEN 500 MG PO TABS: 500.0000 mg | ORAL_TABLET | Freq: Two times a day (BID) | ORAL | 0 refills | Status: AC

## 2022-11-05 MED ORDER — GLUCOSE BLOOD VI STRP: ORAL_STRIP | 12 refills | Status: AC

## 2022-11-05 NOTE — Addendum Note (Signed)
Addended by: Derrek Monaco on: 11/05/2022 11:29 AM   Modules accepted: Level of Service

## 2022-11-05 NOTE — Assessment & Plan Note (Signed)
-   A1C down to 6.4 today! From 9.2 last time - Continue [place] 10mg  daily, Trulicity 0.75mg  weekly, and Metformin 1g daily.  - Continue the excellent lifestyle changes he has made - Continue statin - Urine MACR today

## 2022-11-05 NOTE — Assessment & Plan Note (Signed)
-   Patient has had several months of right knee pain, no preceding trauma, worse with ambulation.  - Continue voltaren gel  - He found injections helpful previously (not steroid, I don't have the records) - he will call that office to get this scheduled again - I offered PT, he will call if he'd like to do PT

## 2022-11-05 NOTE — Assessment & Plan Note (Signed)
-   Patient has had left sided neck pain for 1-2 weeks. No preceding trauma. No numbness, tingling, weakness in his arms or hands. Pain is worse in the morning when he wakes up and moves his head. Also with with twisting his head. I think this is cervical strain vs spondylosis - Naproxen 500mg  BID - neck stretches, I gave him a print out - Call if not improved - next step would be PT, consider x-rays and muscle relaxant

## 2022-11-05 NOTE — Patient Instructions (Addendum)
6Dear Mr Dady,  It was a pleasure seeing you today.  Your A1C was 6.4 - this is excellent news! Your blood pressure looked great too. Continue taking your medicines for your blood pressure and diabetes.  For your neck pain, I think you have some inflammation around your cervical spine. I recommend taking Naproxen 500mg  twice daily for 2 weeks. I have sent this to your pharmacy. I also recommend doing some neck stretches, which I have printed for you. If your pain does not improve, please call or MyChart message me, and I will plan to get x-rays and send you to Physical Therapy as the next step.   For your knee pain, continue using voltaren gel. If you need a referral to the place where you got injections before, just let me know.  I do recommend that you get a flu shot and pneumonia shot, so keep thinking about this.   I'll plan to see you back in 6 months, but please call if you need anything sooner.  Sincerely, Dr. Mercie Eon

## 2022-11-05 NOTE — Assessment & Plan Note (Signed)
-   He politely declined pneumococcal and flu vaccines today

## 2022-11-05 NOTE — Assessment & Plan Note (Signed)
-   Well controled today - Continue Losartan 100-HCT 12.5mg  daily  - BMP

## 2022-11-06 LAB — MICROALBUMIN / CREATININE URINE RATIO
Creatinine, Urine: 135.4 mg/dL
Microalb/Creat Ratio: 11 mg/g{creat} (ref 0–29)
Microalbumin, Urine: 15 ug/mL

## 2022-11-07 LAB — BMP8+ANION GAP
Anion Gap: 18 mmol/L (ref 10.0–18.0)
BUN/Creatinine Ratio: 17 (ref 10–24)
BUN: 22 mg/dL (ref 8–27)
CO2: 21 mmol/L (ref 20–29)
Calcium: 9.7 mg/dL (ref 8.6–10.2)
Chloride: 102 mmol/L (ref 96–106)
Creatinine, Ser: 1.28 mg/dL — ABNORMAL HIGH (ref 0.76–1.27)
Glucose: 159 mg/dL — ABNORMAL HIGH (ref 70–99)
Potassium: 4.2 mmol/L (ref 3.5–5.2)
Sodium: 141 mmol/L (ref 134–144)
eGFR: 61 mL/min/{1.73_m2} (ref >59)

## 2022-12-01 ENCOUNTER — Ambulatory Visit: Payer: Medicare HMO | Admitting: Student

## 2022-12-01 VITALS — BP 136/78 | HR 79 | Wt 221.0 lb

## 2022-12-01 DIAGNOSIS — E118 Type 2 diabetes mellitus with unspecified complications: Secondary | ICD-10-CM

## 2022-12-01 DIAGNOSIS — S91302A Unspecified open wound, left foot, initial encounter: Secondary | ICD-10-CM

## 2022-12-01 DIAGNOSIS — E119 Type 2 diabetes mellitus without complications: Secondary | ICD-10-CM | POA: Diagnosis not present

## 2022-12-01 NOTE — Patient Instructions (Signed)
Thank you, [name] for allowing [place] to provide your care today. Today we discussed a high risk foot wound given your history of diabetes.   I have ordered the following labs for you:  Lab Orders  No laboratory test(s) ordered today     Tests ordered today:  - None  Referrals ordered today:    Referral Orders         Ambulatory referral to Podiatry      I have ordered the following medication/changed the following medications:   Stop the following medications: There are no discontinued medications.   Start the following medications: No orders of the defined types were placed in this encounter.    Follow up: 5 months or earlier as needed   Remember:  - A podiatrist, or foot specialist, will call you to schedule an appointment.   - Please call our office at [phone] if you notice that there is white/yellow discharge, the wound grows bigger, or you notice there is new bleeding or skin breakdown. We may send in some antibiotics at that time if it happens before your podiatry appointment.   - Please be sure to keep both areas clean and wrapped up. Do not wear your boots without having some padding/cover on your wounds.   Should you have any questions or concerns please call the internal medicine clinic at [phone].     Fernando Flegal Colbert Coyer, MD PGY-1 Internal Medicine Teaching Progam [facility]

## 2022-12-01 NOTE — Progress Notes (Signed)
Acute Office Visit  Subjective:     Patient ID: Fernando Ingram, male    DOB: 29-Aug-1955, 67 y.o.   MRN: [id number]  No chief complaint on file.   HPI Patient is in today for left foot wound (in media as well as physical exam section of this note).  Review of Systems  Constitutional:  Negative for chills and fever.  Cardiovascular:  Negative for leg swelling.  Gastrointestinal:  Negative for nausea and vomiting.      Objective:    BP 136/78 (BP Location: Left Arm, Patient Position: Sitting, Cuff Size: Normal)   Pulse 79   Wt 221 lb (100.2 kg)   SpO2 100%   BMI 27.62 kg/m  BP Readings from Last 3 Encounters:  12/01/22 136/78  11/05/22 113/69  04/09/22 (!) 113/58   Wt Readings from Last 3 Encounters:  12/01/22 221 lb (100.2 kg)  11/05/22 220 lb 14.4 oz (100.2 kg)  05/07/22 216 lb (98 kg)    Physical Exam Constitutional:      Appearance: Normal appearance.  HENT:     Head: Normocephalic and atraumatic.     Nose: Nose normal.     Mouth/Throat:     Mouth: Mucous membranes are moist.  Pulmonary:     Effort: Pulmonary effort is normal.  Musculoskeletal:        General: Normal range of motion.  Skin:    Comments: Please see media photos below. Patient presents with a superficial, ulcerated left foot wound near the first MTP joint. No overt bleeding or discharge observed. No surrounding erythema or edema. Not tender to touch. Patient's dorsal foot cool to touch, DPA pulses intact bilaterally, PTA pulses more difficult to palpate. Patient able to wiggle toes on command. Evidence of toenail thickening and yellowing/darkening concerning for toe nail fungus. Right foot with small abrasion on right dorsal medial surface.   Neurological:     General: No focal deficit present.     Mental Status: He is alert.  Psychiatric:        Mood and Affect: Mood normal.        Behavior: Behavior normal.    Left foot    Right foot   No results found for any visits on  12/01/22.     Assessment & Plan:   Problem List Items Addressed This Visit     Open wound of foot, left, initial encounter    Patient with about a week history of left foot wound. Per patient, he wears steel-toed boots for work and has been noticing how they rub against the bunions of his feet. On Monday of last week, he noticed a small blister, and by Wednesday of last week he noticed light red drainage. Denies any pain. Has been avoiding wearing his boots and is wearing open toed shoes instead that do not rub against his feet. He has been using hydrogen peroxide and alcohol to wash out wound every day. He recently noticed another small area on the top of his right foot. Denied any drainage or overt bleeding. On exam, wound appears to be mostly superficial. Patient remains afebrile. No concerns for cellulitis or soft tissue infection at this time. Due to history of diabetes, decreased sensation in his feet, and onychomycosis, will refer to podiatry as he is at greater risk of developing an infection that may lead to amputation if it is not treated and followed up on appropriately. Will provide sterile wrapping and supplies to patient until he can be  evaluated by podiatry.  - Placed referral for podiatry  - Patient to keep foot wound clean and wrapped - Patient told to return for 6 month follow up for regular check up (around April), may return sooner if needed      Other Visit Diagnoses     Diabetic foot (HCC)    -  Primary   Relevant Orders   Ambulatory referral to Podiatry      No orders of the defined types were placed in this encounter.  Return 5 months, for diabetes, HTN, chronic pain . Patient seen with Dr. Criselda Peaches.   Eliasar Hlavaty Colbert Coyer, MD

## 2022-12-03 DIAGNOSIS — S91302A Unspecified open wound, left foot, initial encounter: Secondary | ICD-10-CM | POA: Insufficient documentation

## 2022-12-03 NOTE — Assessment & Plan Note (Signed)
Patient with about a week history of left foot wound. Per patient, he wears steel-toed boots for work and has been noticing how they rub against the bunions of his feet. On Monday of last week, he noticed a small blister, and by Wednesday of last week he noticed light red drainage. Denies any pain. Has been avoiding wearing his boots and is wearing open toed shoes instead that do not rub against his feet. He has been using hydrogen peroxide and alcohol to wash out wound every day. He recently noticed another small area on the top of his right foot. Denied any drainage or overt bleeding. On exam, wound appears to be mostly superficial. Patient remains afebrile. No concerns for cellulitis or soft tissue infection at this time. Due to history of diabetes, decreased sensation in his feet, and onychomycosis, will refer to podiatry as he is at greater risk of developing an infection that may lead to amputation if it is not treated and followed up on appropriately. Will provide sterile wrapping and supplies to patient until he can be evaluated by podiatry.  - Placed referral for podiatry  - Patient to keep foot wound clean and wrapped - Patient told to return for 6 month follow up for regular check up (around April), may return sooner if needed

## 2022-12-04 NOTE — Progress Notes (Signed)
Internal Medicine Clinic Attending  I was physically present during the key portions of the resident provided service and participated in the medical decision making of patient's management care. I reviewed pertinent patient test results.  The assessment, diagnosis, and plan were formulated together and I agree with the documentation in the resident's note.  Patients wound is high risk for infection, does not appear infected today.  Podiatry referral, discussion re: better foot wear.  Wound wrapped today for better healing.   Inez Catalina, MD

## 2022-12-04 NOTE — Addendum Note (Signed)
Addended by: Debe Coder B on: 12/04/2022 10:48 AM   Modules accepted: Level of Service

## 2022-12-17 ENCOUNTER — Encounter: Payer: Self-pay | Admitting: Podiatry

## 2022-12-17 ENCOUNTER — Ambulatory Visit (INDEPENDENT_AMBULATORY_CARE_PROVIDER_SITE_OTHER): Payer: Medicare HMO

## 2022-12-17 ENCOUNTER — Ambulatory Visit: Payer: Medicare HMO | Admitting: Podiatry

## 2022-12-17 DIAGNOSIS — L97522 Non-pressure chronic ulcer of other part of left foot with fat layer exposed: Secondary | ICD-10-CM

## 2022-12-17 DIAGNOSIS — E1142 Type 2 diabetes mellitus with diabetic polyneuropathy: Secondary | ICD-10-CM | POA: Diagnosis not present

## 2022-12-17 DIAGNOSIS — E11621 Type 2 diabetes mellitus with foot ulcer: Secondary | ICD-10-CM | POA: Diagnosis not present

## 2022-12-17 NOTE — Progress Notes (Signed)
  Subjective:  Patient ID: Fernando Ingram, male    DOB: Aug 01, 1955,   MRN: [id number]  No chief complaint on file.   67 y.o. male presents for concern of wound on the top of his left great toe that started three weeks ago. He believes it started after wearing a new pair of steel toes. Has had calluses on the other foot and thought it would heel the same as the other side but opened up. Saw PCP and has been keeping xeroform every other day on the wound. . Relates burning and tingling in their feet. Patient is diabetic and last A1c was  Lab Results  Component Value Date   HGBA1C 6.4 (A) 11/05/2022   .   PCP:  Mercie Eon, MD    . Denies any other pedal complaints. Denies n/v/f/c.   Past Medical History:  Diagnosis Date   Hyperlipidemia    Hypertension    Type 2 diabetes mellitus (HCC)     Objective:  Physical Exam: Vascular: DP/PT pulses 2/4 bilateral. CFT <3 seconds. Absent hair growth on digits. Edema noted to bilateral lower extremities. Xerosis noted bilaterally.  Skin. No lacerations or abrasions bilateral feet. Nails 1-5 bilateral  are thickened discolored and elongated with subungual debris. Ulcer noted to dorsum of left first MPJ area with granular base and surroudning hyperkeratosis. Measures about 2 cm x 1.5 cm x 0.2 cm. No probe to bone. No erythema edema or purulence noted.  Musculoskeletal: MMT 5/5 bilateral lower extremities in DF, PF, Inversion and Eversion. Deceased ROM in DF of ankle joint. Mild HAV deformity noted bilateral and no Rom of the first MPJ bilateral.  Neurological: Sensation intact to light touch. Protective sensation diminished bilateral.    Assessment:   1. Diabetic ulcer of toe of left foot associated with type 2 diabetes mellitus, with fat layer exposed (HCC)   2. Type 2 diabetes mellitus with diabetic polyneuropathy, without long-term current use of insulin (HCC)      Plan:  Patient was evaluated and treated and all questions  answered. -Discussed and educated patient on diabetic foot care, especially with  regards to the vascular, neurological and musculoskeletal systems.  -Stressed the importance of good glycemic control and the detriment of not  controlling glucose levels in relation to the foot. Ulcer left dorsal first MPJ with fat layer exposed  -X-rays reviewed and no osseous erosions noted there is significant degenerative changes noted around first MPJ consistent with end stage arthritis.   -Debridement as below. -Dressed with betadine, DSD. -Off-loading with surgical shoe. Dispensed.  -No abx indicated.  -Discussed glucose control and proper protein-rich diet.  -Discussed if any worsening redness, pain, fever or chills to call or may need to report to the emergency room. Patient expressed understanding.   Procedure: Excisional Debridement of Wound Rationale: Removal of non-viable soft tissue from the wound to promote healing.  Anesthesia: none Pre-Debridement Wound Measurements: Ovelrying slough and hyeprkeratosis  Post-Debridement Wound Measurements: 2 cm x 1.5 cm x 0.2 cm  Type of Debridement: Sharp Excisional Tissue Removed: Non-viable soft tissue Depth of Debridement: subcutaneous tissue. Technique: Sharp excisional debridement to bleeding, viable wound base.  Dressing: Dry, sterile, compression dressing. Disposition: Patient tolerated procedure well. Patient to return in 2 week for follow-up.  Return in about 2 weeks (around 12/31/2022) for wound check.      Louann Sjogren, DPM

## 2022-12-31 ENCOUNTER — Ambulatory Visit: Payer: Medicare HMO | Admitting: Podiatry

## 2022-12-31 ENCOUNTER — Encounter: Payer: Self-pay | Admitting: Podiatry

## 2022-12-31 DIAGNOSIS — E11621 Type 2 diabetes mellitus with foot ulcer: Secondary | ICD-10-CM | POA: Diagnosis not present

## 2022-12-31 DIAGNOSIS — H524 Presbyopia: Secondary | ICD-10-CM | POA: Diagnosis not present

## 2022-12-31 DIAGNOSIS — L97522 Non-pressure chronic ulcer of other part of left foot with fat layer exposed: Secondary | ICD-10-CM

## 2022-12-31 NOTE — Progress Notes (Signed)
  Subjective:  Patient ID: Fernando Ingram, male    DOB: 02-Aug-1955,   MRN: [id number]  Chief Complaint  Patient presents with   Wound Check    Pt presents for a follow of wound on the top of his left great toe pt stated that he is doing so much better.    67 y.o. male presents for follow-up of left foot wound. Relates doing well and has been dressing as instructed. . Relates burning and tingling in their feet. Patient is diabetic and last A1c was  Lab Results  Component Value Date   HGBA1C 6.4 (A) 11/05/2022   .   PCP:  Mercie Eon, MD    . Denies any other pedal complaints. Denies n/v/f/c.   Past Medical History:  Diagnosis Date   Hyperlipidemia    Hypertension    Type 2 diabetes mellitus (HCC)     Objective:  Physical Exam: Vascular: DP/PT pulses 2/4 bilateral. CFT <3 seconds. Absent hair growth on digits. Edema noted to bilateral lower extremities. Xerosis noted bilaterally.  Skin. No lacerations or abrasions bilateral feet. Nails 1-5 bilateral  are thickened discolored and elongated with subungual debris. Ulcer noted to dorsum of left first MPJ area with granular base and surroudning hyperkeratosis. Measures about 0.8 cm x 0.4 cm x 0.2 cm. No probe to bone. No erythema edema or purulence noted.  Musculoskeletal: MMT 5/5 bilateral lower extremities in DF, PF, Inversion and Eversion. Deceased ROM in DF of ankle joint. Mild HAV deformity noted bilateral and no Rom of the first MPJ bilateral.  Neurological: Sensation intact to light touch. Protective sensation diminished bilateral.    Assessment:   1. Diabetic ulcer of toe of left foot associated with type 2 diabetes mellitus, with fat layer exposed (HCC)      Plan:  Patient was evaluated and treated and all questions answered. -Discussed and educated patient on diabetic foot care, especially with  regards to the vascular, neurological and musculoskeletal systems.  -Stressed the importance of good glycemic control and  the detriment of not  controlling glucose levels in relation to the foot. Ulcer left dorsal first MPJ with fat layer exposed  -X-rays reviewed and no osseous erosions noted there is significant degenerative changes noted around first MPJ consistent with end stage arthritis.   -Debridement as below. -Dressed with betadine, DSD. -Off-loading with surgical shoe. Dispensed.  -No abx indicated.  -Discussed glucose control and proper protein-rich diet.  -Discussed if any worsening redness, pain, fever or chills to call or may need to report to the emergency room. Patient expressed understanding.   Procedure: Excisional Debridement of Wound Rationale: Removal of non-viable soft tissue from the wound to promote healing.  Anesthesia: none Pre-Debridement Wound Measurements: Ovelrying slough and hyeprkeratosis  Post-Debridement Wound Measurements: 0.8 cm x 0.4 cm x 0.2 cm  Type of Debridement: Sharp Excisional Tissue Removed: Non-viable soft tissue Depth of Debridement: subcutaneous tissue. Technique: Sharp excisional debridement to bleeding, viable wound base.  Dressing: Dry, sterile, compression dressing. Disposition: Patient tolerated procedure well. Patient to return in 2 week for follow-up.  No follow-ups on file.      Louann Sjogren, DPM

## 2023-01-07 ENCOUNTER — Telehealth: Payer: Self-pay | Admitting: Podiatry

## 2023-01-07 NOTE — Telephone Encounter (Signed)
Completed Disability Attending Physician Statement from [employer] -- Faxed to [phone] .Marland Kitchen...   Called the patient to advise same -- LMVM ....   <><>   per Dr. Ralene Cork, the RTW (tentative) date is 01/29/2023 .Marland Kitchen...    J. Abbott -- 01/07/2023

## 2023-01-08 ENCOUNTER — Telehealth: Payer: Self-pay | Admitting: Podiatry

## 2023-01-08 NOTE — Telephone Encounter (Signed)
Sent fax to [place] with missing items from paperwork sent yesterday .Marland Kitchen...   Fax# [phone] ......    J. Abbott -- 01/08/2023

## 2023-01-13 ENCOUNTER — Telehealth: Payer: Self-pay | Admitting: Podiatry

## 2023-01-13 ENCOUNTER — Encounter: Payer: Self-pay | Admitting: Podiatry

## 2023-01-13 ENCOUNTER — Ambulatory Visit: Payer: Medicare HMO | Admitting: Podiatry

## 2023-01-13 DIAGNOSIS — E1142 Type 2 diabetes mellitus with diabetic polyneuropathy: Secondary | ICD-10-CM

## 2023-01-13 DIAGNOSIS — Z87898 Personal history of other specified conditions: Secondary | ICD-10-CM

## 2023-01-13 NOTE — Telephone Encounter (Signed)
Received call from patient -- just saw Dr. Ralene Cork and requested a letter be faxed to [facility] for his STD -- (found letter on file from the provider -- patient's RTW is 01/20/2023)  The patient could not locate a fax# for [facility] or an e-mail address -- he will find one or the other and call back .Marland Kitchen...     J. Abbott -- 01/13/2023

## 2023-01-13 NOTE — Progress Notes (Signed)
  Subjective:  Patient ID: Fernando Ingram, male    DOB: 1955-09-05,   MRN: [id number]  No chief complaint on file.   67 y.o. male presents for follow-up of left foot wound. Relates doing well and has been dressing as instructed. . Relates burning and tingling in their feet. Patient is diabetic and last A1c was  Lab Results  Component Value Date   HGBA1C 6.4 (A) 11/05/2022   .   PCP:  Mercie Eon, MD    . Denies any other pedal complaints. Denies n/v/f/c.   Past Medical History:  Diagnosis Date   Hyperlipidemia    Hypertension    Type 2 diabetes mellitus (HCC)     Objective:  Physical Exam: Vascular: DP/PT pulses 2/4 bilateral. CFT <3 seconds. Absent hair growth on digits. Edema noted to bilateral lower extremities. Xerosis noted bilaterally.  Skin. No lacerations or abrasions bilateral feet. Nails 1-5 bilateral  are thickened discolored and elongated with subungual debris. Ulcer noted to dorsum of left first MPJ area healed.  No erythema edema or purulence noted.  Musculoskeletal: MMT 5/5 bilateral lower extremities in DF, PF, Inversion and Eversion. Deceased ROM in DF of ankle joint. Mild HAV deformity noted bilateral and no Rom of the first MPJ bilateral.  Neurological: Sensation intact to light touch. Protective sensation diminished bilateral.    Assessment:   1. History of ulceration   2. Type 2 diabetes mellitus with diabetic polyneuropathy, without long-term current use of insulin (HCC)       Plan:  Patient was evaluated and treated and all questions answered. -Discussed and educated patient on diabetic foot care, especially with  regards to the vascular, neurological and musculoskeletal systems.  -Stressed the importance of good glycemic control and the detriment of not  controlling glucose levels in relation to the foot. Ulcer left dorsal first MPJ healed.  -X-rays reviewed and no osseous erosions noted there is significant degenerative changes noted around  first MPJ consistent with end stage arthritis.   -Debridement of hyperkeratotic tissue with underlying ulceration healed.  -Dressed with betadine, DSD. -Off-loading with surgical shoe for a few more days and may return to work in one week.   -No abx indicated.  -Discussed glucose control and proper protein-rich diet.  -Discussed if any worsening redness, pain, fever or chills to call or may need to report to the emergency room. Patient expressed understanding.   Return in 4 weeks for recheck   No follow-ups on file.      Louann Sjogren, DPM

## 2023-02-10 ENCOUNTER — Encounter: Payer: Self-pay | Admitting: Podiatry

## 2023-02-10 ENCOUNTER — Ambulatory Visit: Payer: Medicare Other | Admitting: Podiatry

## 2023-02-10 DIAGNOSIS — Z87898 Personal history of other specified conditions: Secondary | ICD-10-CM

## 2023-02-10 DIAGNOSIS — E1142 Type 2 diabetes mellitus with diabetic polyneuropathy: Secondary | ICD-10-CM | POA: Diagnosis not present

## 2023-02-10 NOTE — Progress Notes (Signed)
  Subjective:  Patient ID: Fernando Ingram, male    DOB: 30-Dec-1955,   MRN: [id number]  No chief complaint on file.   68 y.o. male presents for follow-up of left foot wound. Relates doing well and returned to work without problem. Does relate some continued swelling.  . Relates burning and tingling in their feet. Patient is diabetic and last A1c was  Lab Results  Component Value Date   HGBA1C 6.4 (A) 11/05/2022   .   PCP:  Lovie Clarity, MD    . Denies any other pedal complaints. Denies n/v/f/c.   Past Medical History:  Diagnosis Date   Hyperlipidemia    Hypertension    Type 2 diabetes mellitus (HCC)     Objective:  Physical Exam: Vascular: DP/PT pulses 2/4 bilateral. CFT <3 seconds. Absent hair growth on digits. Edema noted to bilateral lower extremities. Xerosis noted bilaterally.  Skin. No lacerations or abrasions bilateral feet. Nails 1-5 bilateral  are thickened discolored and elongated with subungual debris. Ulcer noted to dorsum of left first MPJ area healed.  No erythema edema or purulence noted.  Musculoskeletal: MMT 5/5 bilateral lower extremities in DF, PF, Inversion and Eversion. Deceased ROM in DF of ankle joint. Mild HAV deformity noted bilateral and no Rom of the first MPJ bilateral.  Neurological: Sensation intact to light touch. Protective sensation diminished bilateral.    Assessment:   1. History of ulceration   2. Type 2 diabetes mellitus with diabetic polyneuropathy, without long-term current use of insulin (HCC)        Plan:  Patient was evaluated and treated and all questions answered. -Discussed and educated patient on diabetic foot care, especially with  regards to the vascular, neurological and musculoskeletal systems.  -Stressed the importance of good glycemic control and the detriment of not  controlling glucose levels in relation to the foot. Ulcer left dorsal first MPJ healed.  -X-rays reviewed and no osseous erosions noted there is  significant degenerative changes noted around first MPJ consistent with end stage arthritis.   -Ulceration remains healed.  -No abx indicated.  -Advised to wear compression stockings for work.  -Discussed glucose control and proper protein-rich diet.  -Discussed if any worsening redness, pain, fever or chills to call or may need to report to the emergency room. Patient expressed understanding.   Return in 3 months for rfc.   Return in about 3 months (around 05/11/2023) for rfc.      Asberry Failing, DPM

## 2023-05-12 ENCOUNTER — Ambulatory Visit: Payer: Medicare Other | Admitting: Podiatry

## 2023-05-12 ENCOUNTER — Encounter: Payer: Self-pay | Admitting: Podiatry

## 2023-05-12 DIAGNOSIS — E1142 Type 2 diabetes mellitus with diabetic polyneuropathy: Secondary | ICD-10-CM

## 2023-05-12 DIAGNOSIS — M79675 Pain in left toe(s): Secondary | ICD-10-CM | POA: Diagnosis not present

## 2023-05-12 DIAGNOSIS — B351 Tinea unguium: Secondary | ICD-10-CM | POA: Diagnosis not present

## 2023-05-12 DIAGNOSIS — M79674 Pain in right toe(s): Secondary | ICD-10-CM

## 2023-05-12 NOTE — Progress Notes (Signed)
  Subjective:  Patient ID: Fernando Ingram, male    DOB: 1955/12/15,   MRN: [id number]  Chief Complaint  Patient presents with   Nail Problem    [facility]    68 y.o. male presents for concern of thickened elongated and painful nails that are difficult to trim. Requesting to have them trimmed today. Relates burning and tingling in their feet. Patient is diabetic and last A1c was  Lab Results  Component Value Date   HGBA1C 6.4 (A) 11/05/2022   .   PCP:  Driscilla George, MD     Past Medical History:  Diagnosis Date   Hyperlipidemia    Hypertension    Type 2 diabetes mellitus (HCC)     Objective:  Physical Exam: Vascular: DP/PT pulses 2/4 bilateral. CFT <3 seconds. Absent hair growth on digits. Edema noted to bilateral lower extremities. Xerosis noted bilaterally.  Skin. No lacerations or abrasions bilateral feet. Nails 1-5 bilateral  are thickened discolored and elongated with subungual debris. Ulcer noted to dorsum of left first MPJ area healed.  No erythema edema or purulence noted.  Musculoskeletal: MMT 5/5 bilateral lower extremities in DF, PF, Inversion and Eversion. Deceased ROM in DF of ankle joint. Mild HAV deformity noted bilateral and no Rom of the first MPJ bilateral.  Neurological: Sensation intact to light touch. Protective sensation diminished bilateral.    Assessment:   1. Pain due to onychomycosis of toenails of both feet   2. Type 2 diabetes mellitus with diabetic polyneuropathy, without long-term current use of insulin (HCC)         Plan:  Patient was evaluated and treated and all questions answered. -Discussed and educated patient on diabetic foot care, especially with  regards to the vascular, neurological and musculoskeletal systems.  -Stressed the importance of good glycemic control and the detriment of not  controlling glucose levels in relation to the foot. Ulcer left dorsal first MPJ healed.  -Discussed supportive shoes at all times and checking feet  regularly.  -Mechanically debrided all nails 1-5 bilateral using sterile nail nipper and filed with dremel without incident  -Answered all patient questions -Patient to return  in 3 months for at risk foot care -Patient advised to call the office if any problems or questions arise in the meantime.   Return in 3 months for rfc.   No follow-ups on file.      Jennefer Moats, DPM

## 2023-05-13 ENCOUNTER — Other Ambulatory Visit: Payer: Self-pay

## 2023-05-13 ENCOUNTER — Encounter: Payer: Self-pay | Admitting: Student

## 2023-05-13 ENCOUNTER — Ambulatory Visit: Admitting: Student

## 2023-05-13 VITALS — BP 134/83 | HR 67 | Temp 98.0°F | Ht 75.0 in | Wt 222.7 lb

## 2023-05-13 DIAGNOSIS — Z7984 Long term (current) use of oral hypoglycemic drugs: Secondary | ICD-10-CM | POA: Diagnosis not present

## 2023-05-13 DIAGNOSIS — I10 Essential (primary) hypertension: Secondary | ICD-10-CM | POA: Diagnosis not present

## 2023-05-13 DIAGNOSIS — E119 Type 2 diabetes mellitus without complications: Secondary | ICD-10-CM

## 2023-05-13 DIAGNOSIS — E785 Hyperlipidemia, unspecified: Secondary | ICD-10-CM | POA: Diagnosis not present

## 2023-05-13 DIAGNOSIS — Z7985 Long-term (current) use of injectable non-insulin antidiabetic drugs: Secondary | ICD-10-CM

## 2023-05-13 LAB — POCT GLYCOSYLATED HEMOGLOBIN (HGB A1C): Hemoglobin A1C: 7.6 % — AB (ref 4.0–5.6)

## 2023-05-13 LAB — GLUCOSE, CAPILLARY: Glucose-Capillary: 138 mg/dL — ABNORMAL HIGH (ref 70–99)

## 2023-05-13 MED ORDER — ATORVASTATIN CALCIUM 20 MG PO TABS
20.0000 mg | ORAL_TABLET | Freq: Every day | ORAL | 3 refills | Status: DC
Start: 2023-05-13 — End: 2023-08-18

## 2023-05-13 MED ORDER — LOSARTAN POTASSIUM-HCTZ 100-12.5 MG PO TABS: 1.0000 | ORAL_TABLET | Freq: Every day | ORAL | 3 refills | Status: AC

## 2023-05-13 MED ORDER — TRULICITY 0.75 MG/0.5ML ~~LOC~~ SOAJ: SUBCUTANEOUS | 2 refills | Status: DC

## 2023-05-13 NOTE — Assessment & Plan Note (Signed)
 A1c increased to 7.6 from 6.4. Currently only taking metformin 1000 mg daily. No longer taking Farxiga and Trulicity due to patient preference. Discussed glycemic control and he is agreeable to only restarting Trulicity since once a week.  Plan -Continue metformin -Restart Trulicity 0.75 mg weekly, consider if need to titrate up  -Restart statin therapy  -F/u visit in 3 months for repeat A1c  -urine ACR at next visit

## 2023-05-13 NOTE — Progress Notes (Signed)
 CC: medication refill  HPI:  [name] is a 68 y.o. male living with a history stated below and presents today for medication refill for T2DM, HTN and HLD. Please see problem based assessment and plan for additional details.  Past Medical History:  Diagnosis Date   Hyperlipidemia    Hypertension    Type 2 diabetes mellitus (HCC)     Current Outpatient Medications on File Prior to Visit  Medication Sig Dispense Refill   atorvastatin (LIPITOR) 20 MG tablet Take 1 tablet (20 mg total) by mouth daily. 90 tablet 3   dapagliflozin propanediol (FARXIGA) 10 MG TABS tablet Take 1 tablet (10 mg total) by mouth daily before breakfast. 90 tablet 0   diclofenac Sodium (VOLTAREN) 1 % GEL Apply 2 g topically 4 (four) times daily. 50 g 2   Dulaglutide (TRULICITY) 0.75 MG/0.5ML SOPN INJECT 0.75 MG SUBCUTANEOUSLY ONE TIME PER WEEK 2 mL 2   glucose blood test strip Use as instructed 100 each 12   losartan-hydrochlorothiazide (HYZAAR) 100-12.5 MG tablet Take 1 tablet by mouth daily. 90 tablet 3   metFORMIN (GLUCOPHAGE) 1000 MG tablet Take 1 tablet (1,000 mg total) by mouth daily with breakfast. 90 tablet 3   No current facility-administered medications on file prior to visit.    No family history on file.  Social History   Socioeconomic History   Marital status: Married    Spouse name: Not on file   Number of children: Not on file   Years of education: Not on file   Highest education level: Not on file  Occupational History   Not on file  Tobacco Use   Smoking status: Unknown   Smokeless tobacco: Never  Substance and Sexual Activity   Alcohol use: Not on file   Drug use: Not on file   Sexual activity: Not on file  Other Topics Concern   Not on file  Social History Narrative   Not on file   Social Drivers of Health   Financial Resource Strain: Low Risk  (11/26/2021)   Overall Financial Resource Strain (CARDIA)    Difficulty of Paying Living Expenses: Not hard at all   Food Insecurity: No Food Insecurity (11/26/2021)   Hunger Vital Sign    Worried About Running Out of Food in the Last Year: Never true    Ran Out of Food in the Last Year: Never true  Transportation Needs: No Transportation Needs (11/26/2021)   PRAPARE - Administrator, Civil Service (Medical): No    Lack of Transportation (Non-Medical): No  Physical Activity: Sufficiently Active (11/26/2021)   Exercise Vital Sign    Days of Exercise per Week: 5 days    Minutes of Exercise per Session: 60 min  Stress: No Stress Concern Present (11/26/2021)   [employer] of Occupational Health - Occupational Stress Questionnaire    Feeling of Stress : Not at all  Social Connections: Moderately Integrated (11/26/2021)   Social Connection and Isolation Panel [NHANES]    Frequency of Communication with Friends and Family: More than three times a week    Frequency of Social Gatherings with Friends and Family: More than three times a week    Attends Religious Services: More than 4 times per year    Active Member of [employer] or Organizations: No    Attends Banker Meetings: Never    Marital Status: Married  Catering manager Violence: Not At Risk (11/26/2021)   Humiliation, Afraid, Rape, and Kick questionnaire  Fear of Current or Ex-Partner: No    Emotionally Abused: No    Physically Abused: No    Sexually Abused: No    Review of Systems: ROS negative except for what is noted on the assessment and plan.  Vitals:   05/13/23 1420  BP: 134/83  Pulse: 67  Temp: 98 F (36.7 C)  TempSrc: Oral  SpO2: 99%  Weight: 222 lb 11.2 oz (101 kg)  Height: 6\' 3"  (1.905 m)   Physical Exam: Constitutional: well-appearing male sitting in chair, in no acute distress Cardiovascular: regular rate and rhythm Pulmonary/Chest: normal work of breathing on room air, lungs clear to auscultation bilaterally Neurological: alert & oriented x 3  Assessment & Plan:   Hypertension BP 134/83.  Not taking his losartan-hydrochlorothiazide 100-12.5 mg for over 3 months due to patient preference. Discussed with patient about BP control and hx of microalbuminuria. He prefers to not restart but will contemplate it. Last BMP in 2024 with normal electrolytes and stable renal.   Plan -Refill for losartan-hydrochlorothiazide sent in case -Reassess at next visit, BP record log provided today   Hyperlipidemia Reports off of atorvastatin 20 mg for over 3 months due to patient preference. Discussed with patient about adherence given hx of HTN/T2DM and his ASCVD risk. He is amenable to restarting it today.   Plan -Restart/refilled atorvastatin  -Repeat lipid panel at next visit   Diabetes mellitus (HCC) A1c increased to 7.6 from 6.4. Currently only taking metformin 1000 mg daily. No longer taking Farxiga and Trulicity due to patient preference. Discussed glycemic control and he is agreeable to only restarting Trulicity since once a week.  Plan -Continue metformin -Restart Trulicity 0.75 mg weekly, consider if need to titrate up  -Restart statin therapy  -F/u visit in 3 months for repeat A1c  -urine ACR at next visit    Patient discussed with Dr. Machen  Clarene Curran, D.O. [facility] Internal Medicine, PGY-2 Phone: [phone] Date 05/13/2023 Time 2:52 PM

## 2023-05-13 NOTE — Assessment & Plan Note (Addendum)
 BP 134/83. Not taking his losartan-hydrochlorothiazide 100-12.5 mg for over 3 months due to patient preference. Discussed with patient about BP control and hx of microalbuminuria. He prefers to not restart but will contemplate it. Last BMP in 2024 with normal electrolytes and stable renal.   Plan -Refill for losartan-hydrochlorothiazide sent in case -Reassess at next visit, BP record log provided today

## 2023-05-13 NOTE — Assessment & Plan Note (Signed)
 Reports off of atorvastatin 20 mg for over 3 months due to patient preference. Discussed with patient about adherence given hx of HTN/T2DM and his ASCVD risk. He is amenable to restarting it today.   Plan -Restart/refilled atorvastatin  -Repeat lipid panel at next visit

## 2023-05-13 NOTE — Patient Instructions (Addendum)
 Thank you, [name] for allowing us  to provide your care today. Today we discussed   -Cholesterol: Restart your atorvastatin 20 mg once a day, we can recheck cholesterol level at next visit -Blood Pressure: Refilled your losartan-hydrochlorothiazide once a day for blood pressure and proteins in your urine  -Diabetes: A1c went up to 7.6, continue metformin and restart Trulicity once a week shot   I have ordered the following medication/changed the following medications:  Start the following medications: Meds ordered this encounter  Medications   atorvastatin (LIPITOR) 20 MG tablet    Sig: Take 1 tablet (20 mg total) by mouth daily.    Dispense:  90 tablet    Refill:  3   Dulaglutide (TRULICITY) 0.75 MG/0.5ML SOAJ    Sig: INJECT 0.75 MG SUBCUTANEOUSLY ONE TIME PER WEEK    Dispense:  2 mL    Refill:  2   losartan-hydrochlorothiazide (HYZAAR) 100-12.5 MG tablet    Sig: Take 1 tablet by mouth daily.    Dispense:  90 tablet    Refill:  3     Follow up: 3 months    Should you have any questions or concerns please call the internal medicine clinic at [phone].    Mackenzey Crownover, D.O. [facility]

## 2023-05-14 NOTE — Progress Notes (Signed)
 Internal Medicine Clinic Attending  Case discussed with the resident at the time of the visit.  We reviewed the resident's history and exam and pertinent patient test results.  I agree with the assessment, diagnosis, and plan of care documented in the resident's note.

## 2023-08-05 ENCOUNTER — Other Ambulatory Visit: Payer: Self-pay | Admitting: Student

## 2023-08-05 DIAGNOSIS — E119 Type 2 diabetes mellitus without complications: Secondary | ICD-10-CM

## 2023-08-11 ENCOUNTER — Ambulatory Visit (INDEPENDENT_AMBULATORY_CARE_PROVIDER_SITE_OTHER): Admitting: Podiatry

## 2023-08-11 DIAGNOSIS — Z91199 Patient's noncompliance with other medical treatment and regimen due to unspecified reason: Secondary | ICD-10-CM

## 2023-08-11 NOTE — Progress Notes (Signed)
 No show

## 2023-08-12 ENCOUNTER — Encounter: Payer: Self-pay | Admitting: *Deleted

## 2023-08-13 NOTE — Progress Notes (Signed)
 Established Patient Office Visit  Subjective   Patient ID: Fernando Ingram, male    DOB: 1955-08-27  Age: 68 y.o. MRN: [id number]  No chief complaint on file.   Fernando Ingram is a 68 y.o. with past medical history of type 2 diabetes, hypertension, hyperlipidemia presenting today for follow-up on chronic conditions.  Review of Systems:  As per assessment and Plan   Objective:     Vitals:   08/14/23 0845  BP: 127/77  Pulse: 76  Temp: 97.7 F (36.5 C)  TempSrc: Oral  SpO2: 97%  Weight: 219 lb 3.2 oz (99.4 kg)  Height: 6' 3 (1.905 m)   Physical Exam  General: Sitting in chair, no acute distress Cardiovascular: Regular rate Pulmonary: Breathing comfortably Abdomen: Soft, nontender, nondistended MSK: No lower extremity edema bilaterally  Results for orders placed or performed in visit on 08/14/23  Lipid Profile  Result Value Ref Range   Cholesterol, Total 126 100 - 199 mg/dL   Triglycerides 37 0 - 149 mg/dL   HDL 45 >60 mg/dL   VLDL Cholesterol Cal 10 5 - 40 mg/dL   LDL Chol Calc (NIH) 71 0 - 99 mg/dL   Chol/HDL Ratio 2.8 0.0 - 5.0 ratio  Glucose, capillary  Result Value Ref Range   Glucose-Capillary 149 (H) 70 - 99 mg/dL  POC Hbg J8R  Result Value Ref Range   Hemoglobin A1C 8.2 (A) 4.0 - 5.6 %   HbA1c POC (<> result, manual entry)     HbA1c, POC (prediabetic range)     HbA1c, POC (controlled diabetic range)      Last metabolic panel Lab Results  Component Value Date   GLUCOSE 159 (H) 11/05/2022   NA 141 11/05/2022   K 4.2 11/05/2022   CL 102 11/05/2022   CO2 21 11/05/2022   BUN 22 11/05/2022   CREATININE 1.28 (H) 11/05/2022   EGFR 61 11/05/2022   CALCIUM  9.7 11/05/2022   PROT 6.7 05/20/2021   ALBUMIN 4.3 05/20/2021   LABGLOB 2.4 05/20/2021   AGRATIO 1.8 05/20/2021   BILITOT 0.7 05/20/2021   ALKPHOS 61 05/20/2021   AST 13 05/20/2021   ALT 12 05/20/2021   Last lipids Lab Results  Component Value Date   CHOL 126 08/14/2023   HDL 45  08/14/2023   LDLCALC 71 08/14/2023   TRIG 37 08/14/2023   CHOLHDL 2.8 08/14/2023   Last hemoglobin A1c Lab Results  Component Value Date   HGBA1C 8.2 (A) 08/14/2023      The ASCVD Risk score (Arnett DK, et al., 2019) failed to calculate for the following reasons:   The valid total cholesterol range is 130 to 320 mg/dL    Assessment & Plan:   Patient discussed with Dr. Francesco  Problem List Items Addressed This Visit       Cardiovascular and Mediastinum   Hypertension (Chronic)   BP Readings from Last 3 Encounters:  08/14/23 127/77  05/13/23 134/83  12/01/22 136/78   Patient reports that he is currently taking losartan -HCTZ 100-12.5 mg daily. Blood pressure today 127/77 in OV. Denies any chest pain or SOB. No vision changes or LE swelling.  Refills needed. - Continue losartan -HCTZ daily        Endocrine   Type 2 diabetes mellitus (HCC) - Primary   Lab Results  Component Value Date   HGBA1C 8.2 (A) 08/14/2023   Last A1c 7.6 checked on 05/13/2023. A1c today 8.2. Patient is currently taking metformin  1000 mg daily, started on  Trulicity  0.75 mg weekly per the last OV. Tolerating well, without side effects. Patient reports that he already bought 3 month supply of Trulicity  last week.  - Advised to start Metformin  XR 500 mg, 2 tablets daily  - Presently, continue Trulicity  0.75 mg weekly, at the next office visit we will consider increasing Trulicity .      Relevant Medications   metFORMIN  (GLUCOPHAGE -XR) 500 MG 24 hr tablet   Other Relevant Orders   POC Hbg A1C (Completed)     Other   Hyperlipidemia (Chronic)   Lab Results  Component Value Date   LDLCALC 71 08/14/2023   Per the last office visit, patient was restarted on atorvastatin  20 mg daily. Last LDL 67 checked on 04/2021. - Will check Lipid panel today to calculate ASCVD risk score. No prior hx of MI or CVA or PAD.       Relevant Orders   Lipid Profile (Completed)    Return in about 3 months (around  11/14/2023) for A1c check, HTN, DM, HLD.    Toma Edwards, DO

## 2023-08-14 ENCOUNTER — Other Ambulatory Visit: Payer: Self-pay | Admitting: Student

## 2023-08-14 ENCOUNTER — Ambulatory Visit: Payer: Self-pay | Admitting: Student

## 2023-08-14 VITALS — BP 127/77 | HR 76 | Temp 97.7°F | Ht 75.0 in | Wt 219.2 lb

## 2023-08-14 DIAGNOSIS — E785 Hyperlipidemia, unspecified: Secondary | ICD-10-CM

## 2023-08-14 DIAGNOSIS — E119 Type 2 diabetes mellitus without complications: Secondary | ICD-10-CM | POA: Diagnosis not present

## 2023-08-14 DIAGNOSIS — I10 Essential (primary) hypertension: Secondary | ICD-10-CM

## 2023-08-14 DIAGNOSIS — Z7984 Long term (current) use of oral hypoglycemic drugs: Secondary | ICD-10-CM

## 2023-08-14 DIAGNOSIS — Z7985 Long-term (current) use of injectable non-insulin antidiabetic drugs: Secondary | ICD-10-CM

## 2023-08-14 LAB — POCT GLYCOSYLATED HEMOGLOBIN (HGB A1C): Hemoglobin A1C: 8.2 % — AB (ref 4.0–5.6)

## 2023-08-14 LAB — GLUCOSE, CAPILLARY: Glucose-Capillary: 149 mg/dL — ABNORMAL HIGH (ref 70–99)

## 2023-08-14 MED ORDER — METFORMIN HCL ER 500 MG PO TB24: 1000.0000 mg | ORAL_TABLET | Freq: Every day | ORAL | 1 refills | Status: DC

## 2023-08-14 MED ORDER — METFORMIN HCL ER (OSM) 1000 MG PO TB24: 1000.0000 mg | ORAL_TABLET | Freq: Every day | ORAL | 3 refills | Status: DC

## 2023-08-14 NOTE — Patient Instructions (Signed)
 Thank you, Fernando Ingram for allowing us  to provide your care today. Today we discussed:  START Metformin  1000 mg, 24 hour tablet, one tablet daily in the morning with breakfast Continue Trulicity  0.75 mg injections weekly We will check your cholesterol today   I have ordered the following labs for you:   Lab Orders         Lipid Profile         Glucose, capillary         POC Hbg A1C      Tests ordered today:  Above   Referrals ordered today:   Referral Orders  No referral(s) requested today     I have ordered the following medication/changed the following medications:   Stop the following medications: Medications Discontinued During This Encounter  Medication Reason   metFORMIN  (GLUCOPHAGE ) 1000 MG tablet      Start the following medications: Meds ordered this encounter  Medications   metformin  (FORTAMET ) 1000 MG (OSM) 24 hr tablet    Sig: Take 1 tablet (1,000 mg total) by mouth daily with breakfast.    Dispense:  30 tablet    Refill:  3     Follow up: 3 months    Remember:   Should you have any questions or concerns please call the internal medicine clinic at [phone].     Rayann Atway, D.O. [facility]

## 2023-08-15 LAB — LIPID PANEL
Chol/HDL Ratio: 2.8 ratio (ref 0.0–5.0)
Cholesterol, Total: 126 mg/dL (ref 100–199)
HDL: 45 mg/dL (ref >39)
LDL Chol Calc (NIH): 71 mg/dL (ref 0–99)
Triglycerides: 37 mg/dL (ref 0–149)
VLDL Cholesterol Cal: 10 mg/dL (ref 5–40)

## 2023-08-17 NOTE — Assessment & Plan Note (Signed)
 Lab Results  Component Value Date   LDLCALC 71 08/14/2023   Per the last office visit, patient was restarted on atorvastatin  20 mg daily. Last LDL 67 checked on 04/2021. - Will check Lipid panel today to calculate ASCVD risk score. No prior hx of MI or CVA or PAD.

## 2023-08-17 NOTE — Assessment & Plan Note (Signed)
 Lab Results  Component Value Date   HGBA1C 8.2 (A) 08/14/2023   Last A1c 7.6 checked on 05/13/2023. A1c today 8.2. Patient is currently taking metformin  1000 mg daily, started on Trulicity  0.75 mg weekly per the last OV. Tolerating well, without side effects. Patient reports that he already bought 3 month supply of Trulicity  last week.  - Advised to start Metformin  XR 500 mg, 2 tablets daily  - Presently, continue Trulicity  0.75 mg weekly, at the next office visit we will consider increasing Trulicity .

## 2023-08-17 NOTE — Assessment & Plan Note (Signed)
 BP Readings from Last 3 Encounters:  08/14/23 127/77  05/13/23 134/83  12/01/22 136/78   Patient reports that he is currently taking losartan -HCTZ 100-12.5 mg daily. Blood pressure today 127/77 in OV. Denies any chest pain or SOB. No vision changes or LE swelling.  Refills needed. - Continue losartan -HCTZ daily

## 2023-08-18 ENCOUNTER — Ambulatory Visit: Payer: Self-pay | Admitting: Student

## 2023-08-18 MED ORDER — ROSUVASTATIN CALCIUM 20 MG PO TABS: 20.0000 mg | ORAL_TABLET | Freq: Every day | ORAL | 11 refills | Status: AC

## 2023-08-20 ENCOUNTER — Other Ambulatory Visit: Payer: Self-pay | Admitting: Student

## 2023-08-20 MED ORDER — METFORMIN HCL 1000 MG PO TABS: 1000.0000 mg | ORAL_TABLET | Freq: Two times a day (BID) | ORAL | 2 refills | Status: DC

## 2023-08-27 NOTE — Progress Notes (Signed)
 Internal Medicine Clinic Attending  Case discussed with the resident at the time of the visit.  We reviewed the resident's history and exam and pertinent patient test results.  I agree with the assessment, diagnosis, and plan of care documented in the resident's note.

## 2023-09-22 ENCOUNTER — Telehealth: Payer: Self-pay | Admitting: *Deleted

## 2023-09-22 NOTE — Telephone Encounter (Signed)
 Patient was identified as falling into the [place] Measure - Diabetes.   Patient was: Appointment already scheduled for:  10.08.25.

## 2023-10-25 ENCOUNTER — Other Ambulatory Visit: Payer: Self-pay | Admitting: Student

## 2023-10-25 DIAGNOSIS — E119 Type 2 diabetes mellitus without complications: Secondary | ICD-10-CM

## 2023-10-26 NOTE — Telephone Encounter (Signed)
 Medication discontinued 08/20/23

## 2023-11-01 ENCOUNTER — Other Ambulatory Visit: Payer: Self-pay | Admitting: Student

## 2023-11-01 DIAGNOSIS — E119 Type 2 diabetes mellitus without complications: Secondary | ICD-10-CM

## 2023-11-02 NOTE — Telephone Encounter (Signed)
 Medication discontinued 08/20/23

## 2023-11-04 ENCOUNTER — Ambulatory Visit

## 2023-11-04 ENCOUNTER — Other Ambulatory Visit: Payer: Self-pay

## 2023-11-04 VITALS — BP 106/64 | HR 74 | Temp 98.3°F | Ht 74.5 in | Wt 222.0 lb

## 2023-11-04 DIAGNOSIS — Z7985 Long-term (current) use of injectable non-insulin antidiabetic drugs: Secondary | ICD-10-CM

## 2023-11-04 DIAGNOSIS — E119 Type 2 diabetes mellitus without complications: Secondary | ICD-10-CM | POA: Diagnosis not present

## 2023-11-04 DIAGNOSIS — E11621 Type 2 diabetes mellitus with foot ulcer: Secondary | ICD-10-CM | POA: Insufficient documentation

## 2023-11-04 DIAGNOSIS — I10 Essential (primary) hypertension: Secondary | ICD-10-CM | POA: Diagnosis not present

## 2023-11-04 DIAGNOSIS — Z79899 Other long term (current) drug therapy: Secondary | ICD-10-CM

## 2023-11-04 DIAGNOSIS — E785 Hyperlipidemia, unspecified: Secondary | ICD-10-CM

## 2023-11-04 LAB — POCT GLYCOSYLATED HEMOGLOBIN (HGB A1C): HbA1c, POC (controlled diabetic range): 8 % — AB (ref 0.0–7.0)

## 2023-11-04 LAB — GLUCOSE, CAPILLARY: Glucose-Capillary: 175 mg/dL — ABNORMAL HIGH (ref 70–99)

## 2023-11-04 MED ORDER — METFORMIN HCL 1000 MG PO TABS: 1000.0000 mg | ORAL_TABLET | Freq: Two times a day (BID) | ORAL | 2 refills | Status: AC

## 2023-11-04 MED ORDER — TRULICITY 1.5 MG/0.5ML ~~LOC~~ SOAJ: 1.5000 mg | SUBCUTANEOUS | 2 refills | Status: AC

## 2023-11-04 NOTE — Assessment & Plan Note (Signed)
 Reordered metformin  1000 mg BID. Increase trulicity  to 1.5 mg. If still tolerating with inadequate response in 1 month, pt advised to notify office so dose can be increased to maximally tolerated dose. Check UACR today.

## 2023-11-04 NOTE — Progress Notes (Signed)
 Subjective:   Patient ID: Fernando Ingram male   DOB: [birth year] 68 y.o.   MRN: [id number]  HPI: Fernando Ingram is a 68 y.o. M PMH T2DM, HLD, HTN here for T2DM follow up.  T2DM - no trouble taking trulicity  0.75, no changes to appetite and has not lost weight, no N/V, difficulties taking the meds. Has been injecting trulicity  into the back of his arm, no missed doses.Was unable to pick up metformin  at the pharmacy so has been off metformin  for the past month. A1c today 8. Does not check blood sugars at home.  Past Medical History:  Diagnosis Date   Hyperlipidemia    Hypertension    Type 2 diabetes mellitus (HCC)    Current Outpatient Medications  Medication Sig Dispense Refill   diclofenac  Sodium (VOLTAREN ) 1 % GEL Apply 2 g topically 4 (four) times daily. 50 g 2   Dulaglutide  (TRULICITY ) 0.75 MG/0.5ML SOAJ INJECT 0.75 MG SUBCUTANEOUSLY ONE TIME PER WEEK 6 mL 1   glucose blood test strip Use as instructed 100 each 12   losartan -hydrochlorothiazide (HYZAAR) 100-12.5 MG tablet Take 1 tablet by mouth daily. 90 tablet 3   metFORMIN  (GLUCOPHAGE ) 1000 MG tablet Take 1 tablet (1,000 mg total) by mouth 2 (two) times daily with a meal. 180 tablet 2   rosuvastatin  (CRESTOR ) 20 MG tablet Take 1 tablet (20 mg total) by mouth daily. 30 tablet 11   No current facility-administered medications for this visit.   History reviewed. No pertinent family history. Social History   Socioeconomic History   Marital status: Married    Spouse name: Not on file   Number of children: Not on file   Years of education: Not on file   Highest education level: Not on file  Occupational History   Not on file  Tobacco Use   Smoking status: Unknown   Smokeless tobacco: Never  Substance and Sexual Activity   Alcohol use: Not on file   Drug use: Not on file   Sexual activity: Not on file  Other Topics Concern   Not on file  Social History Narrative   Not on file   Social Drivers of Health    Financial Resource Strain: Low Risk  (11/26/2021)   Overall Financial Resource Strain (CARDIA)    Difficulty of Paying Living Expenses: Not hard at all  Food Insecurity: No Food Insecurity (11/26/2021)   Hunger Vital Sign    Worried About Running Out of Food in the Last Year: Never true    Ran Out of Food in the Last Year: Never true  Transportation Needs: No Transportation Needs (11/26/2021)   PRAPARE - Administrator, Civil Service (Medical): No    Lack of Transportation (Non-Medical): No  Physical Activity: Sufficiently Active (11/26/2021)   Exercise Vital Sign    Days of Exercise per Week: 5 days    Minutes of Exercise per Session: 60 min  Stress: No Stress Concern Present (11/26/2021)   [employer] of Occupational Health - Occupational Stress Questionnaire    Feeling of Stress : Not at all  Social Connections: Moderately Integrated (11/26/2021)   Social Connection and Isolation Panel    Frequency of Communication with Friends and Family: More than three times a week    Frequency of Social Gatherings with Friends and Family: More than three times a week    Attends Religious Services: More than 4 times per year    Active Member of Clubs or Organizations: No  Attends Banker Meetings: Never    Marital Status: Married   Review of Systems: Pertinent items are noted in HPI. Objective:   Vitals:   11/04/23 0857  BP: 106/64  Pulse: 74  Temp: 98.3 F (36.8 C)  TempSrc: Oral  SpO2: 98%  Weight: 222 lb (100.7 kg)  Height: 6' 2.5 (1.892 m)    Physical Exam: GEN: Well appearing, NAD. Oriented x 3, normal mood and affect  HEENT: Normocephalic, atraumatic, Conjunctiva clear, sclera non-icteric CV: RRR NEURO: moving all extremities spontaneously in upper and lower extremities. PSYCH: Oriented X3, intact recent and remote memory, judgment and insight, normal mood and affect.   Assessment & Plan:   Assessment & Plan Type 2 diabetes mellitus  without complication, without long-term current use of insulin (HCC) Reordered metformin  1000 mg BID. Increase trulicity  to 1.5 mg. If still tolerating with inadequate response in 1 month, pt advised to notify office so dose can be increased to maximally tolerated dose. Check UACR today. Primary hypertension Cont home hyzaar, BP well controlled Hyperlipidemia, unspecified hyperlipidemia type Cont crestor   Jone Dauphin MD

## 2023-11-04 NOTE — Assessment & Plan Note (Signed)
 Cont home hyzaar, BP well controlled

## 2023-11-04 NOTE — Assessment & Plan Note (Signed)
 Cont crestor

## 2023-11-04 NOTE — Patient Instructions (Addendum)
 Thank you for coming in today. If you have any questions or concerns, please feel free to contact me via MyChart or call the office.   Today we INCREASED your Trulicity  dose. If you are not having issues with this increased dose after 1 month, PLEASE MESSAGE ME and I will increase your dose. I also reordered your metformin .  If you have gotten labs today, I will be in touch via MyChart or telephone.   Have a great day.

## 2023-11-05 LAB — MICROALBUMIN / CREATININE URINE RATIO
Creatinine, Urine: 137.8 mg/dL
Microalb/Creat Ratio: 19 mg/g{creat} (ref 0–29)
Microalbumin, Urine: 26.5 ug/mL

## 2023-11-06 ENCOUNTER — Ambulatory Visit: Payer: Self-pay

## 2023-12-29 ENCOUNTER — Telehealth: Payer: Self-pay

## 2023-12-29 NOTE — Progress Notes (Signed)
 Pharmacy Quality Measure Review  This patient is appearing on a report for being at risk of failing the adherence measure for hypertension (ACEi/ARB) medications this calendar year.   Medication: losartan -hydrochlorothiazide 100-12.5 mg Last fill date: 08/08/23 for 90 day supply  Left voicemail for patient to return my call at their convenience. Call the dispensing pharmacy. They state that the prescription was scheduled to be filled in January; however, they were able to fill the medication today through insurance. The prescription will be ready to pick up by 12PM today.   Woodie Jock, PharmD PGY1 Pharmacy Resident  12/29/2023

## 2024-01-05 ENCOUNTER — Telehealth: Payer: Self-pay | Admitting: Pharmacy Technician

## 2024-01-05 NOTE — Progress Notes (Signed)
   01/05/2024  Patient ID: Fernando Ingram, male   DOB: Mar 05, 1955, 68 y.o.   MRN: [id number]  Pharmacy Quality Measure Review  This patient is appearing on a report for being at risk of failing the adherence measure for diabetes medications this calendar year.   Medication: Metformin  and Trulicity  Last fill date: 01/05/2024 for 30 day supply  Contacted pharmacy to facilitate refills. Called paitent to inquire if he had picked up the Metformin  1000mg  bid and the Trulicity  1.5mg  that Dr Shawn ordered for him on 02/04/2023. Patient informs he has not received a text from [facility] informing him it was ready for pick up. Patient was amenable to me calling to get these ordered for him and then calling him back with the cost.  Called [facility] and spoke to Abby. She was able to get these 2 medications filled for a cost of $47 for both meds (Metformin  was $0). She also informs Losartan /hydrochlorothiazide is ready for him to pick up as well for $0.  Attempted to call patient back but his voicemail picked up. Unfortunately, his voicemail was full so no message would be left. Abby at [facility] informs he will receive a text when the medications are ready for pickup.  Fernando Ingram, CPhT [facility] Office: [phone] Email: [email or web address]

## 2024-01-27 NOTE — Progress Notes (Signed)
 Fernando Ingram                                          MRN: [id number]   01/27/2024   The [facility] Quality Team Specialist reviewed this patient medical record for the purposes of chart review for care gap closure. The following were reviewed: chart review for care gap closure-kidney health evaluation for diabetes:eGFR  and uACR.    [facility] Quality Team
